# Patient Record
Sex: Female | Born: 1980 | Race: White | Hispanic: No | Marital: Married | Smoking: Never smoker
Health system: Southern US, Community
[De-identification: ages and names within clinical notes are randomized; demographics above are authoritative.]

## PROBLEM LIST (undated history)

## (undated) DIAGNOSIS — R55 Syncope and collapse: Secondary | ICD-10-CM

## (undated) DIAGNOSIS — Z87440 Personal history of urinary (tract) infections: Secondary | ICD-10-CM

## (undated) DIAGNOSIS — R1031 Right lower quadrant pain: Secondary | ICD-10-CM

## (undated) DIAGNOSIS — Z8659 Personal history of other mental and behavioral disorders: Secondary | ICD-10-CM

## (undated) DIAGNOSIS — E860 Dehydration: Secondary | ICD-10-CM

## (undated) DIAGNOSIS — A549 Gonococcal infection, unspecified: Secondary | ICD-10-CM

## (undated) DIAGNOSIS — E876 Hypokalemia: Secondary | ICD-10-CM

## (undated) DIAGNOSIS — T148XXA Other injury of unspecified body region, initial encounter: Secondary | ICD-10-CM

## (undated) DIAGNOSIS — N12 Tubulo-interstitial nephritis, not specified as acute or chronic: Secondary | ICD-10-CM

## (undated) DIAGNOSIS — R9431 Abnormal electrocardiogram [ECG] [EKG]: Secondary | ICD-10-CM

## (undated) DIAGNOSIS — F410 Panic disorder [episodic paroxysmal anxiety] without agoraphobia: Secondary | ICD-10-CM

## (undated) DIAGNOSIS — F319 Bipolar disorder, unspecified: Secondary | ICD-10-CM

## (undated) HISTORY — DX: Tubulo-interstitial nephritis, not specified as acute or chronic: N12

## (undated) HISTORY — DX: Syncope and collapse: R55

## (undated) HISTORY — DX: Abnormal electrocardiogram (ECG) (EKG): R94.31

## (undated) HISTORY — DX: Dehydration: E86.0

## (undated) HISTORY — DX: Other injury of unspecified body region, initial encounter: T14.8XXA

## (undated) HISTORY — DX: Right lower quadrant pain: R10.31

## (undated) HISTORY — PX: CARDIAC DEFIBRILLATOR PLACEMENT: SHX171

## (undated) HISTORY — DX: Panic disorder (episodic paroxysmal anxiety): F41.0

## (undated) HISTORY — DX: Hypokalemia: E87.6

## (undated) HISTORY — DX: Gonococcal infection, unspecified: A54.9

## (undated) HISTORY — DX: Personal history of urinary (tract) infections: Z87.440

## (undated) HISTORY — DX: Bipolar disorder, unspecified: F31.9

## (undated) HISTORY — DX: Personal history of other mental and behavioral disorders: Z86.59

---

## 2000-02-07 ENCOUNTER — Other Ambulatory Visit: Admission: RE | Admit: 2000-02-07 | Discharge: 2000-02-07 | Payer: Self-pay | Admitting: Obstetrics

## 2000-02-07 ENCOUNTER — Encounter (INDEPENDENT_AMBULATORY_CARE_PROVIDER_SITE_OTHER): Payer: Self-pay

## 2001-03-07 ENCOUNTER — Emergency Department (HOSPITAL_COMMUNITY): Admission: EM | Admit: 2001-03-07 | Discharge: 2001-03-07 | Payer: Self-pay | Admitting: Emergency Medicine

## 2002-12-07 ENCOUNTER — Encounter: Payer: Self-pay | Admitting: Obstetrics and Gynecology

## 2002-12-07 ENCOUNTER — Ambulatory Visit (HOSPITAL_COMMUNITY): Admission: RE | Admit: 2002-12-07 | Discharge: 2002-12-07 | Payer: Self-pay | Admitting: Obstetrics and Gynecology

## 2003-05-18 ENCOUNTER — Encounter: Payer: Self-pay | Admitting: Gastroenterology

## 2003-05-18 ENCOUNTER — Ambulatory Visit (HOSPITAL_COMMUNITY): Admission: RE | Admit: 2003-05-18 | Discharge: 2003-05-18 | Payer: Self-pay | Admitting: Gastroenterology

## 2003-06-07 ENCOUNTER — Emergency Department (HOSPITAL_COMMUNITY): Admission: EM | Admit: 2003-06-07 | Discharge: 2003-06-07 | Payer: Self-pay | Admitting: Emergency Medicine

## 2003-12-13 ENCOUNTER — Other Ambulatory Visit: Admission: RE | Admit: 2003-12-13 | Discharge: 2003-12-13 | Payer: Self-pay | Admitting: Internal Medicine

## 2004-10-25 ENCOUNTER — Inpatient Hospital Stay (HOSPITAL_COMMUNITY): Admission: AD | Admit: 2004-10-25 | Discharge: 2004-10-25 | Payer: Self-pay | Admitting: Obstetrics and Gynecology

## 2004-11-12 ENCOUNTER — Other Ambulatory Visit: Admission: RE | Admit: 2004-11-12 | Discharge: 2004-11-12 | Payer: Self-pay | Admitting: Obstetrics and Gynecology

## 2005-03-24 ENCOUNTER — Inpatient Hospital Stay (HOSPITAL_COMMUNITY): Admission: AD | Admit: 2005-03-24 | Discharge: 2005-03-24 | Payer: Self-pay | Admitting: Obstetrics and Gynecology

## 2005-04-21 ENCOUNTER — Ambulatory Visit (HOSPITAL_COMMUNITY): Admission: RE | Admit: 2005-04-21 | Discharge: 2005-04-21 | Payer: Self-pay | Admitting: Obstetrics and Gynecology

## 2005-05-06 ENCOUNTER — Inpatient Hospital Stay (HOSPITAL_COMMUNITY): Admission: AD | Admit: 2005-05-06 | Discharge: 2005-05-06 | Payer: Self-pay | Admitting: Obstetrics and Gynecology

## 2005-05-07 ENCOUNTER — Inpatient Hospital Stay (HOSPITAL_COMMUNITY): Admission: AD | Admit: 2005-05-07 | Discharge: 2005-05-07 | Payer: Self-pay | Admitting: Obstetrics and Gynecology

## 2005-05-09 ENCOUNTER — Inpatient Hospital Stay (HOSPITAL_COMMUNITY): Admission: AD | Admit: 2005-05-09 | Discharge: 2005-05-12 | Payer: Self-pay | Admitting: Obstetrics and Gynecology

## 2006-07-21 ENCOUNTER — Other Ambulatory Visit: Admission: RE | Admit: 2006-07-21 | Discharge: 2006-07-21 | Payer: Self-pay | Admitting: Obstetrics and Gynecology

## 2006-09-01 ENCOUNTER — Ambulatory Visit: Payer: Self-pay | Admitting: Internal Medicine

## 2006-09-01 ENCOUNTER — Observation Stay (HOSPITAL_COMMUNITY): Admission: EM | Admit: 2006-09-01 | Discharge: 2006-09-04 | Payer: Self-pay | Admitting: Emergency Medicine

## 2006-09-02 ENCOUNTER — Encounter (INDEPENDENT_AMBULATORY_CARE_PROVIDER_SITE_OTHER): Payer: Self-pay | Admitting: Interventional Cardiology

## 2006-09-24 ENCOUNTER — Ambulatory Visit: Payer: Self-pay | Admitting: Internal Medicine

## 2006-10-06 ENCOUNTER — Ambulatory Visit: Payer: Self-pay | Admitting: Internal Medicine

## 2006-10-12 ENCOUNTER — Inpatient Hospital Stay (HOSPITAL_COMMUNITY): Admission: RE | Admit: 2006-10-12 | Discharge: 2006-10-12 | Payer: Self-pay | Admitting: Internal Medicine

## 2006-10-12 ENCOUNTER — Ambulatory Visit: Payer: Self-pay | Admitting: Internal Medicine

## 2006-10-27 ENCOUNTER — Encounter: Admission: RE | Admit: 2006-10-27 | Discharge: 2006-10-27 | Payer: Self-pay | Admitting: Orthopedic Surgery

## 2006-11-05 ENCOUNTER — Ambulatory Visit: Payer: Self-pay

## 2007-03-09 ENCOUNTER — Ambulatory Visit: Payer: Self-pay | Admitting: Internal Medicine

## 2007-06-09 ENCOUNTER — Ambulatory Visit: Payer: Self-pay | Admitting: Internal Medicine

## 2007-09-07 ENCOUNTER — Ambulatory Visit: Payer: Self-pay | Admitting: Internal Medicine

## 2007-12-07 ENCOUNTER — Ambulatory Visit: Payer: Self-pay | Admitting: Internal Medicine

## 2008-04-04 ENCOUNTER — Ambulatory Visit: Payer: Self-pay | Admitting: Internal Medicine

## 2008-07-04 ENCOUNTER — Ambulatory Visit: Payer: Self-pay | Admitting: Internal Medicine

## 2008-10-03 ENCOUNTER — Ambulatory Visit: Payer: Self-pay | Admitting: Internal Medicine

## 2009-01-04 ENCOUNTER — Encounter: Payer: Self-pay | Admitting: Internal Medicine

## 2009-01-19 ENCOUNTER — Ambulatory Visit: Payer: Self-pay | Admitting: Internal Medicine

## 2009-03-22 DIAGNOSIS — I4581 Long QT syndrome: Secondary | ICD-10-CM | POA: Insufficient documentation

## 2009-04-24 ENCOUNTER — Ambulatory Visit: Payer: Self-pay | Admitting: Internal Medicine

## 2009-07-24 ENCOUNTER — Ambulatory Visit: Payer: Self-pay | Admitting: Internal Medicine

## 2009-07-25 ENCOUNTER — Encounter: Payer: Self-pay | Admitting: Internal Medicine

## 2009-08-09 ENCOUNTER — Encounter: Payer: Self-pay | Admitting: Internal Medicine

## 2009-10-09 ENCOUNTER — Ambulatory Visit: Payer: Self-pay | Admitting: Internal Medicine

## 2009-10-09 DIAGNOSIS — I951 Orthostatic hypotension: Secondary | ICD-10-CM | POA: Insufficient documentation

## 2010-01-25 ENCOUNTER — Encounter: Payer: Self-pay | Admitting: Internal Medicine

## 2010-02-04 ENCOUNTER — Inpatient Hospital Stay (HOSPITAL_COMMUNITY): Admission: EM | Admit: 2010-02-04 | Discharge: 2010-02-07 | Payer: Self-pay | Admitting: Internal Medicine

## 2010-02-04 ENCOUNTER — Encounter: Admission: RE | Admit: 2010-02-04 | Discharge: 2010-02-04 | Payer: Self-pay | Admitting: Internal Medicine

## 2010-02-11 ENCOUNTER — Encounter: Payer: Self-pay | Admitting: Internal Medicine

## 2010-02-25 ENCOUNTER — Ambulatory Visit: Payer: Self-pay | Admitting: Internal Medicine

## 2010-03-04 ENCOUNTER — Encounter: Payer: Self-pay | Admitting: Internal Medicine

## 2010-05-28 ENCOUNTER — Ambulatory Visit: Payer: Self-pay | Admitting: Internal Medicine

## 2010-05-29 ENCOUNTER — Encounter: Payer: Self-pay | Admitting: Internal Medicine

## 2010-06-19 ENCOUNTER — Encounter: Payer: Self-pay | Admitting: Internal Medicine

## 2010-08-29 ENCOUNTER — Ambulatory Visit: Payer: Self-pay | Admitting: Internal Medicine

## 2010-10-23 ENCOUNTER — Encounter: Payer: Self-pay | Admitting: Internal Medicine

## 2010-12-16 ENCOUNTER — Encounter (INDEPENDENT_AMBULATORY_CARE_PROVIDER_SITE_OTHER): Payer: Self-pay | Admitting: *Deleted

## 2010-12-31 NOTE — Cardiovascular Report (Signed)
Summary: Office Visit Remote   Office Visit Remote   Imported By: Roderic Ovens 06/20/2010 11:01:43  _____________________________________________________________________  External Attachment:    Type:   Image     Comment:   External Document

## 2010-12-31 NOTE — Letter (Signed)
Summary: Remote Device Check  [employer], Main Office  [address]   [place], [place] [address]   Phone: [phone]  Fax: [phone]     October 23, 2010 MRN: [id number]   [name] [address] [place], [place]  [address]   Dear Ms. Flatley,   Your remote transmission was recieved and reviewed by your physician.  All diagnostics were within normal limits for you.  _____Your next transmission is scheduled for:                       .  Please transmit at any time this day.  If you have a wireless device your transmission will be sent automatically.  __X____Your next office visit is scheduled for: 11-05-10 @ 1550 with Dr Graciela Husbands.     Sincerely,  Vella Kohler

## 2010-12-31 NOTE — Cardiovascular Report (Signed)
Summary: Office Visit Remote  Office Visit Remote   Imported By: Roderic Ovens 08/10/2009 14:19:26  _____________________________________________________________________  External Attachment:    Type:   Image     Comment:   External Document

## 2010-12-31 NOTE — Miscellaneous (Signed)
Summary: DEVICE PRELOAD  Clinical Lists Changes  Observations: Added new observation of ICD INDICATN: LONG QT, HOCM, BRUGADA, SYNCOPE (01/04/2009 14:19) Added new observation of ICDLEADSTAT1: active (01/04/2009 14:19) Added new observation of ICDLEADSER1: [id number] (01/04/2009 14:19) Added new observation of ICDLEADMOD1: 7002  (01/04/2009 14:19) Added new observation of ICDLEADDOI1: 10/12/2006  (01/04/2009 14:19) Added new observation of ICDLEADLOC1: RV  (01/04/2009 14:19) Added new observation of ICD IMP MD: Sherryl Manges, MD  (01/04/2009 14:19) Added new observation of ICD IMPL DTE: 10/12/2006  (01/04/2009 14:19) Added new observation of ICD SERL#: [id number] H  (01/04/2009 14:19) Added new observation of ICD MODL#: 7232  (01/04/2009 14:19) Added new observation of ICDMANUFACTR: Medtronic  (01/04/2009 14:19) Added new observation of CARDIO MD: Sherryl Manges, MD  (01/04/2009 14:19)      ICD Specifications Following MD:  Sherryl Manges, MD     ICD Vendor:  Medtronic     ICD Model Number:  7232     ICD Serial Number:  [id number] ICD DOI:  10/12/2006     ICD Implanting MD:  Sherryl Manges, MD  Lead 1:    Location: RV     DOI: 10/12/2006     Model #: 3086     Serial #: [id number]     Status: active  Indications::  LONG QT, HOCM, BRUGADA, SYNCOPE

## 2010-12-31 NOTE — Letter (Signed)
Summary: Remote Device Check  [facility] [employer], Main Office  [address]   [place], [place] [address]   Phone: [phone]  Fax: [phone]     August 09, 2009 MRN: [id number]   ALLISA EINSPAHR [address] [place], [place]  [address]   Dear Ms. Hohn,   Your remote transmission was recieved and reviewed by your physician.  All diagnostics were within normal limits for you.    ___X___Your next office visit is scheduled for:      November 2010 with Dr Graciela Husbands.  Please call our office to schedule an appointment.    Sincerely,  Proofreader

## 2010-12-31 NOTE — Assessment & Plan Note (Signed)
Summary: defib check.mdt.amber   History of Present Illness: [name] is seen in follow up for syncope and long QT syndrome. She has had no intercurrent episodes.  She is doing quite well  She has however had problems with orthostatic lightheadedness. She has shower/back intolerance.  Her diet is salt replete but fluid deplete.    Allergies: 1)  ! * Sulfa Drugs  Vital Signs:  Patient profile:   30 year old female Height:      64 inches Weight:      148 pounds BMI:     25.50 Pulse rate:   75 / minute Pulse rhythm:   regular BP sitting:   94 / 60  (left arm) Cuff size:   large  Vitals Entered By: Judithe Modest CMA (October 09, 2009 4:02 PM)  Physical Exam  General:  The patient was alert and oriented in no acute distress. HEENT Normal.  Neck veins were flat, carotids were brisk.  Lungs were clear.  Heart sounds were regular without murmurs or gallops.  Abdomen was soft with active bowel sounds. There is no clubbing cyanosis or edema. Skin Warm and dry     ICD Specifications Following MD:  Sherryl Manges, MD     ICD Vendor:  Medtronic     ICD Model Number:  7232     ICD Serial Number:  [id number] ICD DOI:  10/12/2006     ICD Implanting MD:  Sherryl Manges, MD  Lead 1:    Location: RV     DOI: 10/12/2006     Model #: 7002     Serial #: [id number]     Status: active  Indications::  LONG QT, HOCM, BRUGADA, SYNCOPE   ICD Follow Up Remote Check?  No Battery Voltage:  3.14 V     Charge Time:  7.49 seconds     Underlying rhythm:  SR@81  ICD Dependent:  No       ICD Device Measurements Right Ventricle:  Amplitude: 5.4 mV, Impedance: 480 ohms, Threshold: 2.0 V at 0.4 msec Shock Impedance: 68 ohms   Episodes Coumadin:  No Shock:  0     ATP:  0     Nonsustained:  0      Brady Parameters Mode VVI     Lower Rate Limit:  40      Tachy Zones VF:  222     VT1:  162     Next Remote Date:  01/08/2010     Next Cardiology Appt Due:  10/01/2010 Tech Comments:  No parameter  changes.   Device function normal.  [facility] transmissions every 3 months.   ROV 1 year Dr. Graciela Husbands. Altha Harm, LPN  October 09, 2009 4:07 PM   Impression & Recommendations:  Problem # 1:  LONG QT SYNDROME (ICD-426.82) She had no intercurrent problems with ventricular tachycardia or syncope. She is tolerating her not all. She takes it at night. Her updated medication list for this problem includes:    Nadolol 40 Mg Tabs (Nadolol) .Marland Kitchen... 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 We discussed the importance of salt and water repletion. She does preload a former and lousy with the latter. We also discussed maneuvers that she can undertake to try to prevent  Appended Document: defib check.mdt.amber F/U 12 MONTHS WITH DR. Graciela Husbands

## 2010-12-31 NOTE — Cardiovascular Report (Signed)
Summary: Office Visit Remote   Office Visit Remote   Imported By: Roderic Ovens 03/12/2010 16:43:25  _____________________________________________________________________  External Attachment:    Type:   Image     Comment:   External Document

## 2010-12-31 NOTE — Letter (Signed)
Summary: Remote Device Check  [facility] [employer], Main Office  [address]   [place], [place] [address]   Phone: [phone]  Fax: [phone]     March 04, 2010 MRN: [id number]   [name] [address] [place], [place]  [address]   Dear [name],   Your remote transmission was recieved and reviewed by your physician.  All diagnostics were within normal limits for you.  ___X__Your next transmission is scheduled for:   May 28, 2010.  Please transmit at any time this day.  If you have a wireless device your transmission will be sent automatically.     Sincerely,  Proofreader

## 2010-12-31 NOTE — Assessment & Plan Note (Signed)
Summary: 3:50/PC2/KFW   CC:  device check, fatigue, and and chest pain that comes and goes.  History of Present Illness: [name] is seen in follow up for syncope and long QT syndrome. She has had no intercurrent episodes. Review of her monitor demonstrated a few nonsustained episodes more than a year ago; I don't have those stated he wanted to review them.  She raises a number of questions today the first is tan she start to exercise.st that I would be a reasonable thing to do we will continue her on her Corgard for the current time.  She asked whether she could start driving and I told her yes. There has been a great deal of anxiety about returning behind the wheel of a car since her sick also occurred in this con I reviewed with them the statistical likelihood of having an episode behind the wheel of a car.  The other issues she asked whether she could decrease her Corgard; I suggested we do want to get a time as the exercise above  Current Medications (verified): 1)  Prozac 20 Mg Caps (Fluoxetine Hcl) .... Three Times Daily 2)  Nadolol 40 Mg Tabs (Nadolol) .... Take One Tablet By Mouth Daily 3)  Alprazolam 1 Mg Tabs (Alprazolam) .... Take 1/2 To 1 Tablet As Needed  Allergies (verified): 1)  ! * Sulfa Drugs  Vital Signs:  Patient profile:   30 year old female Height:      64 inches Weight:      142.75 pounds Pulse rate:   54 / minute Pulse rhythm:   regular BP sitting:   102 / 76  (left arm) Cuff size:   regular  Vitals Entered By: Judithe Modest CMA (Apr 24, 2009 11:54 AM)  Physical Exam  General:  The patient was alert and oriented in no acute distress.Neck veins were flat, carotids were brisk. Lungs were clear. Heart sounds were regular without murmurs or gallops. Abdomen was soft with active bowel sounds. There is no clubbing cyanosis or edema.    ICD Specifications Following MD:  Sherryl Manges, MD     ICD Vendor:  Medtronic     ICD Model Number:  7232     ICD Serial  Number:  [id number] ICD DOI:  10/12/2006     ICD Implanting MD:  Sherryl Manges, MD  Lead 1:    Location: RV     DOI: 10/12/2006     Model #: 7002     Serial #: [id number]     Status: active  Indications::  LONG QT, HOCM, BRUGADA, SYNCOPE   ICD Specs Remote Check?  No Battery Voltage:  3.13 V     Charge Time:  7.47 seconds     Underlying rhythm:  SR   ICD Device Measurements Atrium:  Right Ventricle:  Amplitude: 7.0 mV, Impedance: 560 ohms, Threshold: 2.0 V at 0.4 msec Left Ventricle:  Shock Impedance: 76 ohms   Episodes Coumadin:  No Shock:  0     ATP:  0     Nonsustained:  3     Ventricular Pacing:  0  Brady Parameters Mode VVI     Lower Rate Limit:  40      Tachy Zones VF:  222     VT1:  162     Next Remote Date:  07/24/2009     Next Cardiology Appt Due:  03/31/2010 Tech Comments:  No changes [facility] Altha Harm, LPN  Apr 24, 2009 12:09 PM  Impression & Recommendations:  Problem # 1:  LONG QT SYNDROME (ICD-426.82) She is doing quite well. As noted in the history of present illness will allow her to begin low-level exercise. We have encouraged her to drive. And she is cleared to go back to work after her son start school in the fall Her updated medication list for this problem includes:    Nadolol 40 Mg Tabs (Nadolol) .Marland Kitchen... Take one tablet by mouth daily  Problem # 2:  IMPLANTATION OF DEFIBRILLATOR/MEDTRONIC MAXIMO VR 7232 (ICD-V45.02) Normal device function.  Patient Instructions: 1)  Your physician recommends that you schedule a follow-up appointment in: 6 months time

## 2010-12-31 NOTE — Cardiovascular Report (Signed)
Summary: Office Visit Remote   Office Visit Remote   Imported By: Roderic Ovens 11/04/2010 15:02:22  _____________________________________________________________________  External Attachment:    Type:   Image     Comment:   External Document

## 2010-12-31 NOTE — Letter (Signed)
Summary: Device-Delinquent Phone [employer], [employer], Main Office  [address]   [place], [place] [address]   Phone: [phone]  Fax: [phone]     January 25, 2010 MRN: [id number]   DEISI SALONGA [address] [place], [place]  [address]   Dear Ms. Stainback,  According to our records, you were scheduled for a device phone transmission on January 08, 2010.     We did not receive any results from this check.  If you transmitted on your scheduled day, please call us to help troubleshoot your system.  If you forgot to send your transmission, please send one upon receipt of this letter.  Thank you,   Architectural technologist [facility]

## 2010-12-31 NOTE — Letter (Signed)
Summary: Remote Device Check  [employer], Main Office  [address]   [place], [place] [address]   Phone: [phone]  Fax: [phone]     June 19, 2010 MRN: [id number]   AERALYN BARNA [address] [place], [place]  [address]   Dear Ms. Reisz,   Your remote transmission was recieved and reviewed by your physician.  All diagnostics were within normal limits for you.  __X___Your next transmission is scheduled for:   08-29-2010.  Please transmit at any time this day.  If you have a wireless device your transmission will be sent automatically.   Sincerely,  Vella Kohler

## 2010-12-31 NOTE — Letter (Signed)
Summary: Device-Delinquent Phone Journalist, newspaper, Main Office  [address]   [place], [place] [address]   Phone: [phone]  Fax: [phone]     February 11, 2010 MRN: [id number]   [name] [address] [place], [place]  [address]   Dear Ms. Nelles,  According to our records, you were scheduled for a device phone transmission on January 08, 2010.     We did not receive any results from this check.  If you transmitted on your scheduled day, please call us to help troubleshoot your system.  If you forgot to send your transmission, please send one upon receipt of this letter.  Thank you,   Architectural technologist [facility]

## 2011-01-02 NOTE — Letter (Signed)
Summary: Appointment - Missed  [facility], Main Office  [address]   [place], [place] [address]   Phone: [phone]  Fax: [phone]     December 16, 2010 MRN: [id number]   [name] [address] [place], [place]  [address]   Dear Ms. Bass,  Our records indicate you missed your appointment on 12-6-2011with Dr.Klein.                                    It is very important that we reach you to reschedule this appointment. We look forward to participating in your health care needs. Please contact us at the number listed above at your earliest convenience to reschedule this appointment.     Sincerely,       Lorne Skeens   [facility] Scheduling Team

## 2011-01-08 ENCOUNTER — Encounter (INDEPENDENT_AMBULATORY_CARE_PROVIDER_SITE_OTHER): Payer: Managed Care, Other (non HMO) | Admitting: Internal Medicine

## 2011-01-08 ENCOUNTER — Encounter: Payer: Self-pay | Admitting: Internal Medicine

## 2011-01-08 DIAGNOSIS — I4581 Long QT syndrome: Secondary | ICD-10-CM

## 2011-01-08 DIAGNOSIS — I498 Other specified cardiac arrhythmias: Secondary | ICD-10-CM

## 2011-01-16 NOTE — Assessment & Plan Note (Signed)
Summary: pc2. gd   CC:  pacer check.  Pt states she is very tired and has concerns over recalled lead.  Pt is very nervous about what this means for her.  History of Present Illness: [name] is seen in follow up for syncope and long QT syndrome. She has had no intercurrent episodes.  She is doing quite well  She has however had problems with orthostatic lightheadedness. Dizziness is better with standing.  Her diet is salt replete but fluid deplete.   she also has symptoms of a cold as is her husband  Current Medications (verified): 1)  Prozac 20 Mg Caps (Fluoxetine Hcl) .... Three Times Daily 2)  Nadolol 40 Mg Tabs (Nadolol) .... Take One Tablet By Mouth Daily 3)  Alprazolam 1 Mg Tabs (Alprazolam) .... Take 1/2 To 1 Tablet As Needed  Allergies (verified): 1)  ! * Sulfa Drugs  Past History:  Past Medical History: Last updated: 03/22/2009 Allergic rhinitis Bipolar disorder Cardiac defibrillator-Medtronic Maximo VR 7232Cx   Syncope  Long QT  Past Surgical History: Last updated: 03/22/2009 AICD-Medtronic Maximo VR 7232Cx  Family History: Last updated: 03/22/2009 Father:Aneurysm at 45 yrs. Mother:good health Siblings:half brother and sister good health Maternal grandfather:  Pacemaker, age 90.  Social History: Last updated: 03/22/2009 Married  Tobacco Use - No.  Alcohol Use - yes occasional  Vital Signs:  Patient profile:   30 year old female Height:      64 inches Weight:      156 pounds BMI:     26.87 Pulse rate:   48 / minute Pulse rhythm:   regular BP sitting:   110 / 64  (left arm) Cuff size:   large  Vitals Entered By: [name] CMA (January 08, 2011 8:53 AM)  Physical Exam  General:  The patient was alert and oriented in no acute distress. HEENT Normal.  Neck veins were flat, carotids were brisk.  Lungs were clear.  Heart sounds were regular without murmurs or gallops.  Abdomen was soft with active bowel sounds. There is no clubbing  cyanosis or edema. Skin Warm and dry     ICD Specifications Following MD:  Andrea Manges, MD     ICD Vendor:  Medtronic     ICD Model Number:  7232     ICD Serial Number:  [id number] ICD DOI:  10/12/2006     ICD Implanting MD:  Andrea Manges, MD  Lead 1:    Location: RV     DOI: 10/12/2006     Model #: 7002     Serial #: [id number]     Status: active  Indications::  LONG QT, HOCM, BRUGADA, SYNCOPE   ICD Follow Up Remote Check?  No Battery Voltage:  3.10 V     Charge Time:  7.76 seconds     Underlying rhythm:  Brady @ 52 ICD Dependent:  No       ICD Device Measurements Right Ventricle:  Amplitude: 5.7 mV, Impedance: 488 ohms, Threshold: 1.5 V at 0.7 msec Shock Impedance: 74 ohms   Episodes Coumadin:  No Shock:  0     ATP:  0     Nonsustained:  1      Brady Parameters Mode VVI     Lower Rate Limit:  40      Tachy Zones VF:  222     VT1:  162     Next Remote Date:  04/10/2011     Next Cardiology Appt Due:  01/02/2012 Tech Comments:  Lead impedance alert values reprogrammed.  1SVT episode noted.   [facility] transmissions every 3 months.  ROV 1 year with Dr. Graciela Husbands. Altha Harm, LPN  January 08, 2011 9:16 AM   Impression & Recommendations:  Problem # 1:  LONG QT SYNDROME (ICD-426.82) NO INTERCURRENT ARRHYHMIA Her updated medication list for this problem includes:    Nadolol 40 Mg Tabs (Nadolol) .Marland Kitchen... 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 The device was reprogrammed to lower the impedance window; the 200 impedance ranged plus or minus current measurements was able to be competent on the low end but not on the high end as the minimum pacing impedance was 1000 ohms  Problem # 4:  IMPLANTATION OF DEFIBRILLATOR/MEDTRONIC MAXIMO VR 7232  (ICD-V45.02) Device parameters and data were reviewed and changes were made as above  Problem # 5:  URI (ICD-465.9) We give her some aspirin   Medication Administration  Medication # 1:    Medication: Tylenol 500 mg tab    Dose: 2 tablets    Route: po    Exp Date: 08/02/2011    Lot #: [id number]    Mfr: Uvaldo Rising    Patient tolerated medication without complications    Given by: Dossie Arbour, RN, BSN (January 08, 2011 9:57 AM)  Orders Added: 1)  EKG w/ Interpretation [93000]  Appended Document: pc2. gd ECG sinus rhythm at 48 General of 0.14/2008/0.44  Appended Document: [facility]     Allergies: 1)  ! * Sulfa Drugs    ICD Specifications Following MD:  Andrea Manges, MD     ICD Vendor:  Medtronic     ICD Model Number:  7232     ICD Serial Number:  [id number] ICD DOI:  10/12/2006     ICD Implanting MD:  Andrea Manges, MD  Lead 1:    Location: RV     DOI: 10/12/2006     Model #: 7002     Serial #: [id number]     Status: active  Indications::  LONG QT, HOCM, BRUGADA, SYNCOPE   ICD Follow Up ICD Dependent:  No      Episodes Coumadin:  No  Brady Parameters Mode VVI     Lower Rate Limit:  40      Tachy Zones VF:  222     VT1:  162     Impression & Recommendations:  Problem # 1:  SINUS BRADYCARDIA (ICD-427.81) we'll keep our eye on this her Corgard dose is low it is been used because of the long QT syndrome. not sure we have attributable symptoms Her updated medication list for this problem includes:    Nadolol 40 Mg Tabs (Nadolol) .Marland Kitchen... Take one tablet by mouth daily

## 2011-02-23 LAB — BASIC METABOLIC PANEL
BUN: 3 mg/dL — ABNORMAL LOW (ref 6–23)
BUN: 3 mg/dL — ABNORMAL LOW (ref 6–23)
CO2: 25 mEq/L (ref 19–32)
CO2: 27 mEq/L (ref 19–32)
Calcium: 7.7 mg/dL — ABNORMAL LOW (ref 8.4–10.5)
Calcium: 8.3 mg/dL — ABNORMAL LOW (ref 8.4–10.5)
Chloride: 109 mEq/L (ref 96–112)
Chloride: 99 mEq/L (ref 96–112)
Creatinine, Ser: 0.84 mg/dL (ref 0.4–1.2)
Creatinine, Ser: 0.87 mg/dL (ref 0.4–1.2)
GFR calc Af Amer: >60 mL/min (ref >60)
GFR calc Af Amer: >60 mL/min (ref >60)
GFR calc non Af Amer: >60 mL/min (ref >60)
GFR calc non Af Amer: >60 mL/min (ref >60)
Glucose, Bld: 120 mg/dL — ABNORMAL HIGH (ref 70–99)
Glucose, Bld: 129 mg/dL — ABNORMAL HIGH (ref 70–99)
Potassium: 2.6 mEq/L — CL (ref 3.5–5.1)
Potassium: 3.8 mEq/L (ref 3.5–5.1)
Sodium: 130 mEq/L — ABNORMAL LOW (ref 135–145)
Sodium: 140 mEq/L (ref 135–145)

## 2011-02-23 LAB — CBC
HCT: 30.6 % — ABNORMAL LOW (ref 36.0–46.0)
HCT: 31.6 % — ABNORMAL LOW (ref 36.0–46.0)
HCT: 34 % — ABNORMAL LOW (ref 36.0–46.0)
HCT: 37 % (ref 36.0–46.0)
Hemoglobin: 10.8 g/dL — ABNORMAL LOW (ref 12.0–15.0)
Hemoglobin: 10.9 g/dL — ABNORMAL LOW (ref 12.0–15.0)
Hemoglobin: 11.7 g/dL — ABNORMAL LOW (ref 12.0–15.0)
Hemoglobin: 12.7 g/dL (ref 12.0–15.0)
MCHC: 34.3 g/dL (ref 30.0–36.0)
MCHC: 34.3 g/dL (ref 30.0–36.0)
MCHC: 34.5 g/dL (ref 30.0–36.0)
MCHC: 35.4 g/dL (ref 30.0–36.0)
MCV: 93 fL (ref 78.0–100.0)
MCV: 93.4 fL (ref 78.0–100.0)
MCV: 93.5 fL (ref 78.0–100.0)
MCV: 94.1 fL (ref 78.0–100.0)
Platelets: 186 10*3/uL (ref 150–400)
Platelets: 206 10*3/uL (ref 150–400)
Platelets: 217 10*3/uL (ref 150–400)
Platelets: 240 10*3/uL (ref 150–400)
RBC: 3.29 MIL/uL — ABNORMAL LOW (ref 3.87–5.11)
RBC: 3.39 MIL/uL — ABNORMAL LOW (ref 3.87–5.11)
RBC: 3.64 MIL/uL — ABNORMAL LOW (ref 3.87–5.11)
RBC: 3.93 MIL/uL (ref 3.87–5.11)
RDW: 13 % (ref 11.5–15.5)
RDW: 13 % (ref 11.5–15.5)
RDW: 13.4 % (ref 11.5–15.5)
RDW: 13.4 % (ref 11.5–15.5)
WBC: 13 10*3/uL — ABNORMAL HIGH (ref 4.0–10.5)
WBC: 18 10*3/uL — ABNORMAL HIGH (ref 4.0–10.5)
WBC: 20.9 10*3/uL — ABNORMAL HIGH (ref 4.0–10.5)
WBC: 9.7 10*3/uL (ref 4.0–10.5)

## 2011-02-23 LAB — COMPREHENSIVE METABOLIC PANEL
ALT: 20 U/L (ref 0–35)
AST: 17 U/L (ref 0–37)
Albumin: 3.5 g/dL (ref 3.5–5.2)
Alkaline Phosphatase: 99 U/L (ref 39–117)
BUN: 4 mg/dL — ABNORMAL LOW (ref 6–23)
CO2: 26 mEq/L (ref 19–32)
Calcium: 9.2 mg/dL (ref 8.4–10.5)
Chloride: 98 mEq/L (ref 96–112)
Creatinine, Ser: 0.9 mg/dL (ref 0.4–1.2)
GFR calc Af Amer: >60 mL/min (ref >60)
GFR calc non Af Amer: >60 mL/min (ref >60)
Glucose, Bld: 107 mg/dL — ABNORMAL HIGH (ref 70–99)
Potassium: 2.9 mEq/L — ABNORMAL LOW (ref 3.5–5.1)
Sodium: 132 mEq/L — ABNORMAL LOW (ref 135–145)
Total Bilirubin: 0.6 mg/dL (ref 0.3–1.2)
Total Protein: 7.9 g/dL (ref 6.0–8.3)

## 2011-02-23 LAB — URINALYSIS, MICROSCOPIC ONLY
Bilirubin Urine: NEGATIVE
Glucose, UA: NEGATIVE mg/dL
Ketones, ur: NEGATIVE mg/dL
Nitrite: NEGATIVE
Protein, ur: NEGATIVE mg/dL
Specific Gravity, Urine: 1.041 — ABNORMAL HIGH (ref 1.005–1.030)
Urobilinogen, UA: 0.2 mg/dL (ref 0.0–1.0)
pH: 6 (ref 5.0–8.0)

## 2011-02-23 LAB — URINE CULTURE
Colony Count: 50000
Special Requests: NEGATIVE

## 2011-02-23 LAB — CULTURE, BLOOD (ROUTINE X 2): Culture: NO GROWTH

## 2011-02-23 LAB — PREGNANCY, URINE: Preg Test, Ur: NEGATIVE

## 2011-04-10 ENCOUNTER — Encounter: Payer: Managed Care, Other (non HMO) | Admitting: *Deleted

## 2011-04-15 NOTE — Assessment & Plan Note (Signed)
[facility]                         ELECTROPHYSIOLOGY OFFICE NOTE   [name], [name]                    MRN:          [id number]  DATE:04/04/2008                            DOB:          [birth year]    [name] is seen in follow-up for syncope in the setting of long QT  about 18 months ago.  She has had no intercurrent syncope.  She has had  problems however with presyncope which is largely positional,  accentuated around the time of her period.  Her fluid intake is quite  notable but it is loaded with caffeinated beverages.   MEDICATIONS:  1. Prozac 20 b.i.d.  2. Nadolol 40.   PHYSICAL EXAMINATION:  Her blood pressure was 90/60 today, her pulse was  60.  LUNGS:  Clear.  HEART:  Sounds were regular.  EXTREMITIES:  Without edema.  SKIN:  Warm and dry.  She was in no acute distress.   Interrogation of her Medtronic ICD demonstrates an R-wave of 7.4 with  impedance of 520, threshold 2 volts at 0.4.  Battery voltage 3.19.   IMPRESSION:  1. Long QT syndrome.  2. Syncope associated with #1.  3. Status post ICD for #1 and #2.  4. Orthostatic intolerance.   [name] is doing fine from a tachyarrhythmia point of view.  We have  reviewed the mechanisms of orthostatic intolerance as well as some  nonmedicinal therapeutic intervention, i.e. increasing fluid intake  around the time her menses, decreasing her caffeine intake to reduce its  diuretic effects and maintain her salt intake.   We will see her again in one year's time.     Duke Salvia, MD, [facility]  Electronically Signed    SCK/MedQ  DD: 04/04/2008  DT: 04/04/2008  Job #: [id number]

## 2011-04-16 ENCOUNTER — Encounter: Payer: Self-pay | Admitting: *Deleted

## 2011-04-18 NOTE — Op Note (Signed)
   [name], [name]                         ACCOUNT NO.:  [id number]   MEDICAL RECORD NO.:  [id number]                   PATIENT TYPE:  AMB   LOCATION:  ENDO                                 FACILITY:  [facility]   PHYSICIAN:  John C. Madilyn Fireman, M.D.                 DATE OF BIRTH:  14-Dec-1980   DATE OF PROCEDURE:  05/18/2003  DATE OF DISCHARGE:                                 OPERATIVE REPORT   PROCEDURE:  Esophagogastroduodenoscopy.   INDICATION FOR PROCEDURE:  Black heme-positive stools and anemia.   DESCRIPTION OF PROCEDURE:  The patient was placed in the left lateral  decubitus position and placed on the pulse monitor with continuous low-flow  oxygen delivered by nasal cannula.  She was sedated with 75 mcg IV fentanyl  and 6 mg IV Versed.  The Olympus video endoscope was advanced under direct  vision into the oropharynx and esophagus.  The esophagus was straight and of  normal caliber with the squamocolumnar line at 38 cm.  There was a minimal,  widely patent lower esophageal ring seen when the LES was fully relaxed.  Otherwise, no abnormality was noted of the esophagus.  The stomach was  entered, and a small amount of liquid secretions were suctioned from the  fundus.  Retroflexed view of the cardia was unremarkable.  The fundus, body,  antrum, and pylorus all appeared normal.  The duodenum was entered, and both  the bulb and second portion were well-inspected and appeared to be within  normal limits.  The scope was then withdrawn and this patient returned to  the recovery room in stable condition.  She tolerated the procedure well,  and there were no immediate complications.   IMPRESSION:  1. Patent lower esophageal ring.  2. Otherwise, normal study.    PLAN:  1. Consider Merckle scan given documentation of bleeding and negative EGD.  2. Continue Protonix for now.                                               John C. Madilyn Fireman, M.D.    JCH/MEDQ  D:  05/18/2003  T:   05/18/2003  Job:  [id number]   cc:   Georgann Housekeeper, M.D.  [address]  [place]  [place] [address]  Fax: [phone]   Lilyan Punt. Sydnee Levans, M.D.  [address]  [place]  [place] [address]  Fax: [phone]
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
## 2011-04-18 NOTE — Assessment & Plan Note (Signed)
[facility]                         ELECTROPHYSIOLOGY OFFICE NOTE   [name], [name]                    MRN:          [id number]  DATE:03/09/2007                            DOB:          1981-07-02    [name] is seen following ICD implantation for syncope in the context  of a prolonged QT interval.  She is doing okay from that point of view.  She has some fatigue, and I wonder if this is related to the Inderal-LA.  On examination, her blood pressure was 102/66, pulse was 60, lungs were  clear, heart sounds were regular, and the device pocket was well healed.   Interrogation of her Medtronic device demonstrates an R-wave of 6.4 with  an impedence of 656 and a threshold of 2 volts at 0.3.  Battery voltage  is 3.21.  There are no intercurrent episodes.   IMPRESSION:  1. Syncope.  2. Long QT.  3. Status post implantable cardioverter-defibrillator for the above.  4. Fatigue, question related to her Inderal.   I have given her prescriptions for alternative beta blockers.   If there is anything we can do further, we will be glad to help.  There  may be some disability issues that we can help support her, also.     Duke Salvia, MD, [facility]  Electronically Signed    SCK/MedQ  DD: 03/12/2007  DT: 03/12/2007  Job #: [id number]   cc:   Georgann Housekeeper, MD

## 2011-04-18 NOTE — Assessment & Plan Note (Signed)
 HEALTHCARE                         ELECTROPHYSIOLOGY OFFICE NOTE   [name], [name]                    MRN:          [id number]  DATE:03/09/2007                            DOB:          [birth year]    [name] is seen, she has syncope with long QT status post ICD  implantation undertaken in November 2007. She has had no recurrent  syncope, there is no intercurrent episodes.   Her major complaint is of tiredness and dizziness.   MEDICATIONS:  1. Inderal LA 80.  2. Prozac 20 b.i.d..   PHYSICAL EXAMINATION:  VITAL SIGNS:  Her blood pressure is 102/66, pulse  was 60.  LUNGS:  Clear.  HEART:  Sounds were regular.  EXTREMITIES:  Without edema.   Interrogation of her Medtronic [id number] ICD demonstrates an R wave of 6.4  with impedance of 656 and a threshold of 2 volts at 0.3, battery voltage  3.21. There are 6 episodes of nonsustained tachycardia. The device was  reprogrammed to assure capture.   IMPRESSION:  1. Long QT with syncope.  2. Status post ICD for the above.  3. Beta blocker.  4. Fatigue, question related to 3.   I have given her prescriptions for metoprolol ER 50, nadolol 40, and  atenolol 50 to take as an alternative. She will let us know after she  has gone through these trials to see if any of them ameliorate her  symptoms.   Otherwise we will see her again back in November.     Duke Salvia, MD, [facility]  Electronically Signed    SCK/MedQ  DD: 03/09/2007  DT: 03/09/2007  Job #: [id number]
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
## 2011-04-18 NOTE — Assessment & Plan Note (Signed)
[facility]                                 ON-CALL NOTE   [name], [name]                    MRN:          [id number]  DATE:07/22/2007                            DOB:          1981-01-17    [name] is a 30 year old Caucasian female of Dr. Sherryl Manges with a  history of cardiac defibrillator which was implanted in December 2007  secondary to prolonged QT interval and near syncope with palpitations.  The patient calls stating that she took an NSAID because she was having  some pain with her teeth and realized that after she had taken it two  days ago that she should not have taken it as a result of her prolonged  QT interval syndrome. At the time that the patient called, I did not  have access to her records and she was very vague about what she had  taken, and was having trouble looking up the medications on the  Internet. The patient became very inpatient with me as I did not have  access to her records and did not know her history. When I requested her  history, she stated that she had a syndrome and that Dr. Graciela Husbands was  treating for it and did not want her to take any NSAIDs. When I asked  her why that she was not to take the NSAIDs, she said that she did not  know. She thought that it had something to do with her stomach and her  syndrome. When I proceeded to ask of what she syndrome she had, she  again became inpatient and stated that she had a prolonged QT syndrome.  At the time when the patient was talking with me, she had her 68-year-old  son on her lap who was crying loudly and it was difficult for me to hear  her. I again questioned the patient on what I could do for her and she  became very inpatient and stated   if you are going to have that  attitude, there is nothing you can do for me.  When I persisted and  stated that I was available to help her with whatever questions she had,  she stated that she was able to find the medicine  on the Internet and no  longer needed my help and hung up on me.     Bettey Mare. Lyman Bishop, NP  Electronically Signed    KML/MedQ  DD: 07/22/2007  DT: 07/24/2007  Job #: [id number]
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
## 2011-04-18 NOTE — Op Note (Signed)
NAME:  CIRA, DEYOE NO.:  [id number]   MEDICAL RECORD NO.:  [id number]          PATIENT TYPE:  INP   LOCATION:  6533                         FACILITY:  [facility]   PHYSICIAN:  Duke Salvia, MD, FACCDATE OF BIRTH:  1981/11/28   DATE OF PROCEDURE:  10/12/2006  DATE OF DISCHARGE:  10/12/2006                                 OPERATIVE REPORT   PREOPERATIVE DIAGNOSIS:  Syncope with long QT.   POSTOPERATIVE DIAGNOSIS:  Syncope with long QT.   PROCEDURE:  Implantable defibrillator.   SURGEON:  Duke Salvia, MD, [facility].   DESCRIPTION OF PROCEDURE:  Following the obtaining of informed consent, the  patient was brought to the electrophysiology laboratory and placed on the  fluoroscopic table in the supine position.  After routine prep and drape of  the left  upper chest, lidocaine was infiltrated in the  prepectoral/subclavicular region.  An incision was made and carried down to  the layer of the prepectoral fascia using electrocautery and sharp  dissection.  A pocket was formed similarly.  Hemostasis was obtained.   Thereafter, attention was turned to gaining access to the extrathoracic left  subclavian vein, which was accomplished without difficulty and without the  aspiration of air of puncture of the artery.  A single venipuncture was  accomplished.  A guide wire was placed and retained.   Sequentially, 7-French tear-away introducer sheaths were placed, through  which was passed initially a St. Jude 7002, 60-cm, single-coil active-  fixation defibrillator lead, serial #[id number].  This was manipulated to the  right ventricular apex, where the bipolar R wave was 8.3, with a pacing  impedance of 1321 ohms, and a threshold of 2.1 V at 0.5 msec.  Current at  threshold was 1.8 mA.  The current of injury was brisk, and there was no  diaphragmatic pacing at 10 V.  This lead was then attached to a Medtronic  Maximo [id number] ICD, serial #[id number].  Through the device,  the bipolar R  wave was 6.3 with a threshold of 1 V.  The pacing impedance was 610 ohms,  and the defibrillator coil impedance was 63 ohms.   At this point, defibrillation threshold testing was undertaken.  Ventricular  fibrillation was induced via the T-wave shock.  After a total duration of 6  seconds, a 13-J shock was delivered through a measured resistance of 58  ohms, terminating ventricular fibrillation and restoring sinus rhythm.  At  this point, the device was implanted.  The pocket was copiously irrigated  with antibiotic-containing saline solution.  Hemostasis was assured, and the  lead and the pulse generator were placed in the pocket and secured to the  prepectoral fascia.  The wound was then closed in 3  layers in the normal fashion.  The wound was washed and dried, and a benzoin  and Steri-Strips dressing was applied.  Needle counts, sponge counts and  instrument counts were correct at the end of the procedure according to the  staff.  The patient tolerated the procedure without apparent complication.      Duke Salvia, MD, [facility]  Electronically  Signed     SCK/MEDQ  D:  10/12/2006  T:  10/12/2006  Job:  [id number]   cc:   Georgann Housekeeper, MD  Electrophysiology Laboratory  [facility]

## 2011-04-18 NOTE — Discharge Summary (Signed)
NAME:  Andrea, [name] NO.:  [id number]   MEDICAL RECORD NO.:  [id number]                   PATIENT TYPE:  OUT   LOCATION:  XRAY                                 FACILITY:  [facility]   PHYSICIAN:  [name], M.D.                     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   PROCEDURES:  Decompressive laminectomy and posterior lumbar interbody  fusion, L4-5 with transitional segment.   SURGEON:  [name], M.D.   ASSISTANT:  [name], M.D.   ANESTHESIA:  General endotracheal.   HISTORY OF PRESENT ILLNESS:  The patient is a very pleasant 30 year old  female who has had longstanding back and leg pain radiating down in what  appeared to be an L5 and L4 distribution to the top of her foot and big toe.  Preoperative imaging showed severe spinal stenosis and spondylolisthesis.  The patient was recommended a decompressive stabilization procedure.  I  extensively went over the risks and benefits of surgery with her, and she  decided to proceed forward.   HOSPITAL COURSE:  The patient was admitted and went to the operating room  with performance of the procedure.  Postop the patient did very well and  went to the recovery room and the floor.  On the floor the patient was  afebrile with stable vital signs and doing great with no leg pain.  The  patient was maintained on flat bed rest secondary to a spinal fluid leak for  three days postoperatively, and the patient continued to improve.  Did have  some postop ileus, and this progressively improved over the several days  following.  She had some difficulty with pain control in her back, and she  was discontinued off her morphine PCA and changed over to Demerol also  secondary to ileus;  however, over the next couple of days the patient  continued to improve, remained afebrile with stable vital signs,  neurologically stable.  By day #3, the patient was able to be progressively  mobilized, ambulated well with a walker and physical therapy, and was able  to be discharged to [facility] and rehab on transfer.  Maintained on intermittent  Demerol and Percocet for pain, and the patient was discharged to rehab,  scheduled to follow up in two weeks.                                               [name], M.D.    GC/MEDQ  D:  02/02/2003  T:  02/02/2003  Job:  [id number]
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
## 2011-04-18 NOTE — Discharge Summary (Signed)
NAMESALAM, [name]             ACCOUNT NO.:  [id number]   MEDICAL RECORD NO.:  [id number]          PATIENT TYPE:  INP   LOCATION:  9130                          FACILITY[facility]  [facility]   PHYSICIAN:  Charles A. Delcambre, MDDATE OF BIRTH:  06-Dec-1980   DATE OF ADMISSION:  05/09/2005  DATE OF DISCHARGE:  05/12/2005                                 DISCHARGE SUMMARY   PRIMARY DISCHARGE DIAGNOSES:  1.  Intrauterine pregnancy 38 weeks.  2.  Failure to progress.  3.  Arrest of descent.  4.  Occiput transverse.   PROCEDURE:  Primary low transverse cesarean section.   DISPOSITION:  The patient was discharged home to follow up in the office in  two days for discontinuation of staples.  She was given PIH precautions as  well as instructed to call for a temperature greater than 100 degrees, or  incisional redness or drainage, increased pain or bleeding, headaches,  scotomata  , right upper quadrant pain.   DISCHARGE MEDICATIONS:  She was given a prescription for:  1.  Percocet 5/325, 1-2 p.o. q.3h. p.r.n. #40.  2.  Hemocyte one p.o. every day, #30.   DISCHARGE INSTRUCTIONS:  1.  She was to convalesce at home.  2.  No driving for two weeks.  3.  No bathing for two weeks, shower okay.  4.  No heavy lifting greater than 30 pounds for one month.   LABORATORY:  Postoperative hematocrit 28.2, hemoglobin 9.4.  PIH labs  otherwise within normal limits.  Placenta returned with mild acute  chorioamnionitis.   OPERATIVE FINDINGS:  Vigorous female, Apgar's 8 and 9.  Cord venous blood gas  7.37.  Cord arterial blood gas was clotted.  Baby weighed 3280 grams.   HOSPITAL COURSE:  The patient was admitted, underwent Pitocin induction,  artifical rupture of membranes was done.  She progressed onto complete  dilation.  Pushed for two hours and 15 minutes to exhaustion.  She had no  significant change with pushing.  Operative intervention for vaginal  delivery was not recommended secondary to station +1 and  occiput bump  palpable on the baby's head felt secondary to arrest and pelvic dystocia.  Recommendation was for cesarean section.  The patient gave informed consent  and we proceeded with a cesarean section.  Postoperatively, she had routine  postoperative course with the exception of mild blood pressure increase of a  labile nature.  She had a temperature of 101 on post-op day #1 as well with  a fever noted at the time of delivery as I recall cefoxitin  was continued  four additional doses postpartum.  She thereafter remained afebrile.  Blood  pressures did remain in the 140/80, occasionally 150/88 type of range.  PIH  labs were negative.  She voided without  difficulty, tolerated a general diet, pain was controlled.  Morning of  discharge, she did have 50/90, repeat blood pressure was 147/89 with normal  labs again checked.  She was discharged home for further followup in the  office with precautions as noted above.       CAD/MEDQ  D:  06/04/2005  T:  06/04/2005  Job:  [id number]

## 2011-04-18 NOTE — Op Note (Signed)
Andrea, [name]             ACCOUNT NO.:  [id number]   MEDICAL RECORD NO.:  [id number]          PATIENT TYPE:  INP   LOCATION:  9130                          FACILITY[facility]  [facility]   PHYSICIAN:  Charles A. Delcambre, MDDATE OF BIRTH:  [birth year]   DATE OF PROCEDURE:  05/09/2005  DATE OF DISCHARGE:                                 OPERATIVE REPORT   PREOPERATIVE DIAGNOSES:  1.  Intrauterine pregnancy, 38 weeks.  2.  Failure to progress.  3.  Arrest of descent.  4.  Occiput transverse.   POSTOPERATIVE DIAGNOSES:  1.  Intrauterine pregnancy, 38 weeks.  2.  Failure to progress.  3.  Arrest of descent.  4.  Occiput transverse.  5.  Nuchal cord x1.   DESCRIPTION OF PROCEDURE:  Primary low transverse cesarean section.   SURGEON:  Charles A. Sydnee Cabal, M.D.   COMPLICATIONS:  None.   ESTIMATED BLOOD LOSS:  700 mL.   OPERATIVE FINDINGS:  Nuchal cord x1.  Vigorous female, Apgars 8 and 9.  Cord  venous blood gas 7.37, arterial blood gas clotted.   SPECIMENS:  Placenta to pathology.   ANESTHESIA:  Epidural.   COUNTS:  Instrument, sponge and needle counts correct x2.   DESCRIPTION OF PROCEDURE:  The patient was taken to the operating room and  placed in the supine position.  Epidural was dosed and was adequate.  Sterile prep and drape was undertaken.  Pfannenstiel incision was made with  the knife, carried down to the fascia.  The fascia was incised with the  knife.  Rectus sheath was released superiorly and inferiorly without  difficulty.  Rectus muscle were abundantly dissected in the midline.  The  peritoneum was entered with Metzenbaum scissors without damage to bowel,  bladder or vascular structures.  Traction was used to extend the peritoneal  incision.  Bladder blade was placed.  Vesicouterine peritoneum was incised  with Metzenbaum scissors.  Blunt dissection was used to develop the bladder  flap.  The bladder flap was replaced and lower uterine segment transverse  incision  was made with amniotomy without damage to the infant.  Traction was  used to extend the incision.  The hand was inserted.   Infant was lifted out of the pelvis occiput to the uterine incision.  Fundal  pressure was applied by the operation assistant and the infant was delivered  without difficulty.  Nuchal cord was reduced for delivery.  Cord was  clamped.  Infant was shown to the parents and handed off to the  neonatologist in attendance.  The placenta was manually expressed after cord  blood and cord gases were taken.  Cord gas for arterial specimen was  difficult to achieve.  Small amount of blood was taken only.   The uterus was exteriorized for repair.  Internal surface was wiped clean  with a moistened lap.  Uterus had a laceration down the left angle.  This  was carefully closed with #1 chromic separately.  The  small arterial  bleeder was closed with figure-of-eight 2-0 Vicryl without injury to the  surrounding structures.  The uterus was enclosed in a  single layer with #1  chromic running locking suture.  The lower uterine segment beneath the  bladder flap hemostasis was achieved with two figure-of-eight sutures of 2-0  Vicryl with good hemostasis resulting.  The bladder flap hemostasis was  excellent.  Irrigation was carried out.  Bladder was intact and no evidence  of injury to the bladder was noted.  Uterine incision and hemostasis was  excellent.  Uterus was reinternalized into the intraperitoneal cavity.  Subfascial hemostasis was excellent.  The fascia was then closed with #1  Vicryl running nonlocking suture.  Hemostasis was excellent.  Skin was then  closed with skin clips and sterile dressing was applied.  The patient was  taken to the recovery room with physician in attendance, having tolerated  the procedure well.       CAD/MEDQ  D:  05/09/2005  T:  05/10/2005  Job:  [id number]

## 2011-04-18 NOTE — Consult Note (Signed)
NAME:  [name], [name] NO.:  [id number]   MEDICAL RECORD NO.:  [id number]          PATIENT TYPE:  MAT   LOCATION:  MATC                          FACILITY:  [facility]   PHYSICIAN:  Juan H. Lily Peer, M.D.DATE OF BIRTH:  01-01-1981   DATE OF CONSULTATION:  10/25/2004  DATE OF DISCHARGE:                                   CONSULTATION   HISTORY OF PRESENT ILLNESS:  The patient is a 30 year old gravida 1, para 0  who stated that her last menstrual period was what she thought was July 25, 2004, which would place her currently at 12.[redacted] weeks gestation.  The  patient presented to [facility] complaining of lower abdominal  discomfort and pulling sensation.  No vaginal discharge per se.  No fever,  no chills, no nausea, vomiting.  No problems with bowel movements or  urination.   PHYSICAL EXAMINATION:  VITAL SIGNS:  Her vital signs when she came in were  temperature 98.6, pulse 105, respirations 20, blood pressure 124/62.  GENERAL:  The patient did not appear to be in any acute distress.  ABDOMEN:  Soft, nontender, although the discomfort that she experienced was  able to be reproduced by laterally attempting to displace the uterus, which  was now 12.5 weeks, and some discomfort was noted, but no rebound or  guarding.  PELVIC:  Essentially unremarkable.  No cervical motion tenderness.  The  cervix was long, closed, and posteriorly.  Fetal heart tones were  appreciated.   LABORATORY DATA:  A urinalysis was done, which demonstrated a specific  gravity of 1.025, and all other parameters were negative.   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  She was content to hear the baby's heart tones.  She has  a follow up appointment with Dr. Sydnee Cabal in the next several weeks, and  she will follow accordingly.  She will continue her prenatal vitamins, and  return to the emergency room or the office on a p.r.n. basis.   Otherwise, a  routine prenatal visit with Dr. Sydnee Cabal.     Juan   JHF/MEDQ  D:  10/25/2004  T:  10/25/2004  Job:  [id number]   cc:   Leonette Most A. Sydnee Cabal, MD  Fax: [phone]
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
## 2011-04-18 NOTE — Consult Note (Signed)
   [name], [name]                         ACCOUNT NO.:  [id number]   MEDICAL RECORD NO.:  [id number]                   PATIENT TYPE:  AMB   LOCATION:  ENDO                                 FACILITY:  [facility]   PHYSICIAN:  John C. Madilyn Fireman, M.D.                 DATE OF BIRTH:  03/14/1981   DATE OF CONSULTATION:  05/18/2003  DATE OF DISCHARGE:                                   CONSULTATION   REFERRING PHYSICIAN:  Georgann Housekeeper, M.D.   REASON FOR CONSULTATION:  GI bleeding.   HISTORY OF PRESENT ILLNESS:  The patient is a 30 year old white female who  noted weakness two mornings ago and then developed abdominal discomfort and  noted black stool on the same day.  She had one or two black stools the days  since and continued to have diffuse periumbilical abdominal pain without  nausea, vomiting, or maroon or bloody stools.  She does not use any  nonsteroidal anti-inflammatory drugs.  She went to see Georgann Housekeeper, M.D.  and had a hemoglobin of 11.6 yesterday.  We discussed her case and had her  come in today for EGD and other evaluation and hemoglobin today is 10.6.   PAST MEDICAL HISTORY:  Depression and panic disorder.   PAST SURGICAL HISTORY:  None.   ALLERGIES:  None.   MEDICATIONS:  1. Prozac.  2. Klonopin.   FAMILY HISTORY:  Noncontributory.   SOCIAL HISTORY:  The patient is married.  She works at [employer].  Denies alcohol or tobacco use.   PHYSICAL EXAMINATION:  GENERAL:  Well-developed, well-nourished white female  in no acute distress.  HEART:  Regular rate and rhythm without murmur.  LUNGS:  Clear.  HEENT:  Unremarkable.  ABDOMEN:  Soft, nontender with normoactive bowel sounds.  No  hepatosplenomegaly, mass, or guarding.  There is mild diffuse tenderness,  particularly in the epigastrium and periumbilical area.  RECTAL:  Heme-positive by Georgann Housekeeper, M.D. yesterday.    IMPRESSION:  Gastrointestinal bleeding with upper tract source suggested.   PLAN:   Will proceed with EGD today.                                               John C. Madilyn Fireman, M.D.    JCH/MEDQ  D:  05/18/2003  T:  05/18/2003  Job:  [id number]   cc:   Georgann Housekeeper, M.D.  [address]  [place]  [place] [address]  Fax: [phone]

## 2011-04-18 NOTE — Assessment & Plan Note (Signed)
[facility]                         ELECTROPHYSIOLOGY OFFICE NOTE   [name], [name]                    MRN:          [id number]  DATE:11/05/2006                            DOB:          [birth year]    [name] was seen today in the clinic on November 05, 2006, for a wound  check of her newly implanted Medtronic model number [id number] [id number].  Date  of implant was October 12, 2006, for syncope and long QT.  On  interrogation of her device today, her battery voltage was 3.23 with a  charge time of 8.83 seconds.  R waves measured 6.1 millivolts with a  ventricular pacing threshold of 1 volt at 0.7 milliseconds and an  ventricular lead impedance of 728 ohms.  Shock impedance was 72.  There  were no episodes since implant date.  I did program onset and no other  changes.  She will be seen again in 3 months' time.  Her Steri-Strips  had already been removed.  Her wound is without redness or edema.      Altha Harm, LPN  Electronically Signed      Duke Salvia, MD, [facility]  Electronically Signed   PO/MedQ  DD: 11/05/2006  DT: 11/05/2006  Job #: [id number]

## 2011-04-22 ENCOUNTER — Other Ambulatory Visit: Payer: Self-pay

## 2011-04-22 ENCOUNTER — Ambulatory Visit (INDEPENDENT_AMBULATORY_CARE_PROVIDER_SITE_OTHER): Payer: Managed Care, Other (non HMO) | Admitting: *Deleted

## 2011-04-22 DIAGNOSIS — I4581 Long QT syndrome: Secondary | ICD-10-CM

## 2011-05-08 ENCOUNTER — Encounter: Payer: Self-pay | Admitting: *Deleted

## 2011-06-05 NOTE — Progress Notes (Signed)
ICD remote 

## 2011-07-24 ENCOUNTER — Encounter: Payer: Managed Care, Other (non HMO) | Admitting: *Deleted

## 2011-07-31 ENCOUNTER — Encounter: Payer: Self-pay | Admitting: *Deleted

## 2011-09-20 ENCOUNTER — Encounter: Payer: Self-pay | Admitting: Internal Medicine

## 2011-09-20 ENCOUNTER — Other Ambulatory Visit: Payer: Self-pay | Admitting: Internal Medicine

## 2011-09-22 ENCOUNTER — Ambulatory Visit (INDEPENDENT_AMBULATORY_CARE_PROVIDER_SITE_OTHER): Payer: Managed Care, Other (non HMO) | Admitting: *Deleted

## 2011-09-22 DIAGNOSIS — I4581 Long QT syndrome: Secondary | ICD-10-CM

## 2011-09-22 DIAGNOSIS — R55 Syncope and collapse: Secondary | ICD-10-CM

## 2011-10-03 NOTE — Progress Notes (Signed)
icd remote check  

## 2011-10-21 ENCOUNTER — Encounter: Payer: Self-pay | Admitting: *Deleted

## 2011-12-07 ENCOUNTER — Encounter: Payer: Self-pay | Admitting: *Deleted

## 2011-12-07 ENCOUNTER — Emergency Department (HOSPITAL_COMMUNITY)
Admission: EM | Admit: 2011-12-07 | Discharge: 2011-12-07 | Disposition: A | Discharge status: Home / Self Care | Payer: Managed Care, Other (non HMO) | Attending: Emergency Medicine | Admitting: Emergency Medicine

## 2011-12-07 ENCOUNTER — Emergency Department (HOSPITAL_COMMUNITY): Payer: Managed Care, Other (non HMO)

## 2011-12-07 DIAGNOSIS — R509 Fever, unspecified: Secondary | ICD-10-CM | POA: Insufficient documentation

## 2011-12-07 DIAGNOSIS — R110 Nausea: Secondary | ICD-10-CM | POA: Insufficient documentation

## 2011-12-07 DIAGNOSIS — H53149 Visual discomfort, unspecified: Secondary | ICD-10-CM | POA: Insufficient documentation

## 2011-12-07 DIAGNOSIS — R51 Headache: Secondary | ICD-10-CM

## 2011-12-07 DIAGNOSIS — Z95810 Presence of automatic (implantable) cardiac defibrillator: Secondary | ICD-10-CM | POA: Insufficient documentation

## 2011-12-07 MED ORDER — METOCLOPRAMIDE HCL 5 MG/ML IJ SOLN
10.0000 mg | Freq: Once | INTRAMUSCULAR | Status: AC
  Administered 2011-12-07: 10 mg via INTRAVENOUS
  Filled 2011-12-07: qty 2

## 2011-12-07 MED ORDER — SODIUM CHLORIDE 0.9 % IV BOLUS (SEPSIS)
500.0000 mL | Freq: Once | INTRAVENOUS | Status: AC
  Administered 2011-12-07: 500 mL via INTRAVENOUS

## 2011-12-07 NOTE — ED Notes (Signed)
Pt reports having head pressure x 2 months.  Was treated with prednisone, antibiotics for sinusitis without relief.  Reports that the pain has increased tonight.  No distress, pt ambulatory.  Photophobia.

## 2011-12-07 NOTE — ED Notes (Signed)
Pt states reglan reduced pain to 8/10.  Dr Rubin Payor notified.  Await CT.

## 2011-12-07 NOTE — ED Notes (Signed)
Pt to ED c/o L temporal pain that radiates down L cheek.  Pt recently finished antibiotics with no improvement.  Pt photophobic.  Pt appears in great pain.  Pain is a pressure and resembles her many frequent sinus infections, though is much worse.

## 2011-12-07 NOTE — ED Provider Notes (Signed)
History     CSN: [id number]  Arrival date & time 12/07/11  0239   First MD Initiated Contact with Patient 12/07/11 [phone]      Chief Complaint  Patient presents with  . Headache    (Consider location/radiation/quality/duration/timing/severity/associated sxs/prior treatment) Patient is a 31 y.o. female presenting with headaches. The history is provided by the patient.  Headache  Associated symptoms include nausea. Pertinent negatives include no shortness of breath and no vomiting.   patient states she's had a headache since around Christmastime. States it is on the front of her head and feels a shooting stabbing. She states that she had a fever one started. She states she saw urgent care she was given prednisone antibiotics. She states it is not improved. She is nauseous this without vomiting. No diarrhea. The light bothers her. No vision changes. She states she is supposed to wear glasses. No nasal drainage now. She states when she tries she does have some from her nose so. She has an AICD for prolonged QT syndrome.  History reviewed. No pertinent past medical history.  Past Surgical History  Procedure Date  . Cardiac defibrillator placement     History reviewed. No pertinent family history.  History  Substance Use Topics  . Smoking status: Never Smoker   . Smokeless tobacco: Not on file  . Alcohol Use: No    OB History    Grav Para Term Preterm Abortions TAB SAB Ect Mult Living                  Review of Systems  Constitutional: Negative for activity change and appetite change.  HENT: Negative for neck stiffness.   Eyes: Positive for photophobia. Negative for pain.  Respiratory: Negative for chest tightness and shortness of breath.   Cardiovascular: Negative for chest pain and leg swelling.  Gastrointestinal: Positive for nausea. Negative for vomiting, abdominal pain and diarrhea.  Genitourinary: Negative for flank pain.  Musculoskeletal: Negative for back pain.    Skin: Negative for rash.  Neurological: Positive for headaches. Negative for weakness and numbness.  Psychiatric/Behavioral: Negative for behavioral problems.    Allergies  Dilaudid and Sulfonamide derivatives  Home Medications   Current Outpatient Rx  Name Route Sig Dispense Refill  . ACETAMINOPHEN 500 MG PO TABS Oral Take 1,000 mg by mouth every 6 (six) hours as needed. For pain     . ALPRAZOLAM 1 MG PO TABS Oral Take 1 mg by mouth at bedtime.      . FLUOXETINE HCL 40 MG PO CAPS Oral Take 40 mg by mouth 3 (three) times daily.      . GUAIFENESIN ER 600 MG PO TB12 Oral Take 1,200 mg by mouth 2 (two) times daily.      . GUAIFENESIN-CODEINE 100-10 MG/5ML PO SYRP Oral Take 5-10 mLs by mouth every 4 (four) hours as needed. For cough     . NADOLOL 20 MG PO TABS Oral Take 20 mg by mouth daily.        BP 92/68  Pulse 75  Temp(Src) 97.5 F (36.4 C) (Oral)  Resp 16  SpO2 97%  Physical Exam  Nursing note and vitals reviewed. Constitutional: She is oriented to person, place, and time. She appears well-developed and well-nourished.  HENT:  Head: Normocephalic and atraumatic.  Eyes: EOM are normal. Pupils are equal, round, and reactive to light.  Neck: Normal range of motion. Neck supple.  Cardiovascular: Normal rate, regular rhythm and normal heart sounds.   No murmur  heard. Pulmonary/Chest: Effort normal and breath sounds normal. No respiratory distress. She has no wheezes. She has no rales.       AICD left chest wall  Abdominal: Soft. Bowel sounds are normal. She exhibits no distension. There is no tenderness. There is no rebound and no guarding.  Musculoskeletal: Normal range of motion.  Neurological: She is alert and oriented to person, place, and time. No cranial nerve deficit.  Skin: Skin is warm and dry.  Psychiatric: She has a normal mood and affect. Her speech is normal.    ED Course  Procedures (including critical care time)  Labs Reviewed - No data to display Ct Head  Wo Contrast  12/07/2011  *RADIOLOGY REPORT*  Clinical Data: Headache  CT HEAD WITHOUT CONTRAST  Technique:  Contiguous axial images were obtained from the base of the skull through the vertex without contrast.  Comparison: None.  Findings: There is no evidence for acute hemorrhage, hydrocephalus, mass lesion, or abnormal extra-axial fluid collection.  No definite CT evidence for acute infarction.  The visualized paranasal sinuses and mastoid air cells are predominately clear.  IMPRESSION: No acute intracranial abnormality.  Original Report Authenticated By: Waneta Martins, M.D.     1. Headache       MDM  Headache for the last 2 months on and off. Initially treated as sinusitis by urgent care. No improvement. CT was done here and was negative. Patient feels better after Reglan. She be discharged home to followup as needed        Juliet Rude. Rubin Payor, MD 12/07/11 [phone]

## 2011-12-07 NOTE — ED Notes (Signed)
PERRLA.

## 2011-12-07 NOTE — ED Notes (Signed)
CT called and they stated they are ready and will come for the pt.

## 2012-01-27 ENCOUNTER — Encounter: Payer: Managed Care, Other (non HMO) | Admitting: Internal Medicine

## 2012-01-29 ENCOUNTER — Ambulatory Visit: Payer: Managed Care, Other (non HMO) | Admitting: Physician Assistant

## 2012-10-29 ENCOUNTER — Encounter: Payer: Self-pay | Admitting: *Deleted

## 2012-10-29 DIAGNOSIS — Z95810 Presence of automatic (implantable) cardiac defibrillator: Secondary | ICD-10-CM | POA: Insufficient documentation

## 2012-11-19 ENCOUNTER — Encounter: Payer: Self-pay | Admitting: Internal Medicine

## 2012-12-09 ENCOUNTER — Encounter: Payer: Self-pay | Admitting: *Deleted

## 2013-03-02 ENCOUNTER — Telehealth: Payer: Self-pay | Admitting: Internal Medicine

## 2013-03-02 NOTE — Telephone Encounter (Signed)
03-02-13 lmm @ 250pm for pt to call to set up past due pacer ck, certified letter mail rtn/mt

## 2014-06-15 ENCOUNTER — Encounter: Payer: Self-pay | Admitting: Internal Medicine

## 2015-04-25 ENCOUNTER — Encounter: Payer: Managed Care, Other (non HMO) | Admitting: Internal Medicine

## 2015-05-07 ENCOUNTER — Encounter: Payer: Self-pay | Admitting: Internal Medicine

## 2016-03-29 ENCOUNTER — Encounter (HOSPITAL_COMMUNITY): Payer: Self-pay | Admitting: Nurse Practitioner

## 2016-03-29 ENCOUNTER — Emergency Department (HOSPITAL_COMMUNITY)
Admission: EM | Admit: 2016-03-29 | Discharge: 2016-03-29 | Disposition: A | Discharge status: Home / Self Care | Payer: Managed Care, Other (non HMO) | Attending: Emergency Medicine | Admitting: Emergency Medicine

## 2016-03-29 ENCOUNTER — Encounter: Payer: Self-pay | Admitting: Internal Medicine

## 2016-03-29 DIAGNOSIS — Z45010 Encounter for checking and testing of cardiac pacemaker pulse generator [battery]: Secondary | ICD-10-CM | POA: Diagnosis present

## 2016-03-29 DIAGNOSIS — Z8639 Personal history of other endocrine, nutritional and metabolic disease: Secondary | ICD-10-CM | POA: Insufficient documentation

## 2016-03-29 DIAGNOSIS — Z8619 Personal history of other infectious and parasitic diseases: Secondary | ICD-10-CM | POA: Insufficient documentation

## 2016-03-29 DIAGNOSIS — Z8781 Personal history of (healed) traumatic fracture: Secondary | ICD-10-CM | POA: Diagnosis not present

## 2016-03-29 DIAGNOSIS — Z87440 Personal history of urinary (tract) infections: Secondary | ICD-10-CM | POA: Diagnosis not present

## 2016-03-29 DIAGNOSIS — F410 Panic disorder [episodic paroxysmal anxiety] without agoraphobia: Secondary | ICD-10-CM | POA: Diagnosis not present

## 2016-03-29 DIAGNOSIS — Z79899 Other long term (current) drug therapy: Secondary | ICD-10-CM | POA: Diagnosis not present

## 2016-03-29 DIAGNOSIS — Z4502 Encounter for adjustment and management of automatic implantable cardiac defibrillator: Secondary | ICD-10-CM

## 2016-03-29 DIAGNOSIS — Z87448 Personal history of other diseases of urinary system: Secondary | ICD-10-CM | POA: Diagnosis not present

## 2016-03-29 DIAGNOSIS — F319 Bipolar disorder, unspecified: Secondary | ICD-10-CM | POA: Insufficient documentation

## 2016-03-29 NOTE — ED Notes (Signed)
Pt reports he defibrillator has been making an alarm noise all week. she denies pain, sob, dizziness, other complaints. She is alert and breathing easily
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
## 2016-06-12 ENCOUNTER — Encounter: Payer: Self-pay | Admitting: Internal Medicine

## 2016-06-12 ENCOUNTER — Ambulatory Visit (INDEPENDENT_AMBULATORY_CARE_PROVIDER_SITE_OTHER): Payer: Managed Care, Other (non HMO) | Admitting: Internal Medicine

## 2016-06-12 ENCOUNTER — Encounter: Payer: Self-pay | Admitting: *Deleted

## 2016-06-12 VITALS — BP 110/60 | HR 58 | Ht 64.0 in | Wt 151.8 lb

## 2016-06-12 DIAGNOSIS — I4581 Long QT syndrome: Secondary | ICD-10-CM

## 2016-06-12 DIAGNOSIS — Z01812 Encounter for preprocedural laboratory examination: Secondary | ICD-10-CM

## 2016-06-12 DIAGNOSIS — R55 Syncope and collapse: Secondary | ICD-10-CM
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
## 2016-06-12 NOTE — Patient Instructions (Addendum)
Medication Instructions: - Your physician recommends that you continue on your current medications as directed. Please refer to the Current Medication list given to you today.  Labwork: - see procedure sheet  Procedures/Testing: - Your physician has recommended that you have a defibrillator generator (battery) change. Please see the instruction sheet given to you today for more information.  Follow-Up: - Your physician recommends that you schedule a follow-up appointment in: 10-14 days for a wound check (from 07/02/16) with the Device Clinic.  Any Additional Special Instructions Will Be Listed Below (If Applicable). - I will forward your information to Dr[facility] Sidney [facility]Sumy [facility]- she should be in touch with you to discuss genetic testing    If you need a refill on your cardiac medications before your next appointment, please call your pharmacy.

## 2016-06-13 ENCOUNTER — Other Ambulatory Visit: Payer: Self-pay | Admitting: Internal Medicine

## 2016-06-13 LAB — CUP PACEART INCLINIC DEVICE CHECK
Battery Voltage: 2.62 V
Brady Statistic RV Percent Paced: 0 %
Date Time Interrogation Session: 20170713181255
HighPow Impedance: 100 Ohm
HighPow Impedance: 102 Ohm
HighPow Impedance: 102 Ohm
HighPow Impedance: 58 Ohm
HighPow Impedance: 85 Ohm
HighPow Impedance: 86 Ohm
HighPow Impedance: 88 Ohm
HighPow Impedance: 88 Ohm
HighPow Impedance: 88 Ohm
HighPow Impedance: 90 Ohm
HighPow Impedance: 90 Ohm
HighPow Impedance: 94 Ohm
HighPow Impedance: 94 Ohm
HighPow Impedance: 94 Ohm
HighPow Impedance: 94 Ohm
HighPow Impedance: 98 Ohm
Implantable Lead Implant Date: 20071112
Implantable Lead Location: 753860
Implantable Lead Model: 7002
Lead Channel Impedance Value: 544 Ohm
Lead Channel Impedance Value: 544 Ohm
Lead Channel Impedance Value: 544 Ohm
Lead Channel Impedance Value: 544 Ohm
Lead Channel Impedance Value: 552 Ohm
Lead Channel Impedance Value: 552 Ohm
Lead Channel Impedance Value: 552 Ohm
Lead Channel Impedance Value: 560 Ohm
Lead Channel Impedance Value: 560 Ohm
Lead Channel Impedance Value: 568 Ohm
Lead Channel Impedance Value: 568 Ohm
Lead Channel Impedance Value: 584 Ohm
Lead Channel Impedance Value: 592 Ohm
Lead Channel Impedance Value: 608 Ohm
Lead Channel Impedance Value: 632 Ohm
Lead Channel Sensing Intrinsic Amplitude: 6.8 mV
Lead Channel Sensing Intrinsic Amplitude: 7 mV
Lead Channel Sensing Intrinsic Amplitude: 7.1 mV
Lead Channel Sensing Intrinsic Amplitude: 7.1 mV
Lead Channel Sensing Intrinsic Amplitude: 7.2 mV
Lead Channel Sensing Intrinsic Amplitude: 7.2 mV
Lead Channel Sensing Intrinsic Amplitude: 7.2 mV
Lead Channel Sensing Intrinsic Amplitude: 7.2 mV
Lead Channel Sensing Intrinsic Amplitude: 7.4 mV
Lead Channel Sensing Intrinsic Amplitude: 7.4 mV
Lead Channel Sensing Intrinsic Amplitude: 7.5 mV
Lead Channel Sensing Intrinsic Amplitude: 7.7 mV
Lead Channel Sensing Intrinsic Amplitude: 7.7 mV
Lead Channel Sensing Intrinsic Amplitude: 7.9 mV
Lead Channel Sensing Intrinsic Amplitude: 8.2 mV
Lead Channel Setting Pacing Amplitude: 4 V
Lead Channel Setting Pacing Pulse Width: 0.4 ms
Lead Channel Setting Sensing Sensitivity: 0.3 mV

## 2016-06-25 ENCOUNTER — Other Ambulatory Visit (INDEPENDENT_AMBULATORY_CARE_PROVIDER_SITE_OTHER): Payer: Managed Care, Other (non HMO)

## 2016-06-25 DIAGNOSIS — I4581 Long QT syndrome: Secondary | ICD-10-CM | POA: Diagnosis not present

## 2016-06-25 DIAGNOSIS — Z01812 Encounter for preprocedural laboratory examination: Secondary | ICD-10-CM

## 2016-06-25 LAB — CBC WITH DIFFERENTIAL/PLATELET
Basophils Absolute: 81 cells/uL (ref 0–200)
Basophils Relative: 1 %
Eosinophils Absolute: 405 cells/uL (ref 15–500)
Eosinophils Relative: 5 %
HCT: 38.9 % (ref 35.0–45.0)
Hemoglobin: 13 g/dL (ref 11.7–15.5)
Lymphocytes Relative: 31 %
Lymphs Abs: 2511 cells/uL (ref 850–3900)
MCH: 30.6 pg (ref 27.0–33.0)
MCHC: 33.4 g/dL (ref 32.0–36.0)
MCV: 91.5 fL (ref 80.0–100.0)
MPV: 9.3 fL (ref 7.5–12.5)
Monocytes Absolute: 486 cells/uL (ref 200–950)
Monocytes Relative: 6 %
Neutro Abs: 4617 cells/uL (ref 1500–7800)
Neutrophils Relative %: 57 %
Platelets: 322 10*3/uL (ref 140–400)
RBC: 4.25 MIL/uL (ref 3.80–5.10)
RDW: 13.7 % (ref 11.0–15.0)
WBC: 8.1 10*3/uL (ref 3.8–10.8)

## 2016-06-25 LAB — BASIC METABOLIC PANEL
BUN: 5 mg/dL — ABNORMAL LOW (ref 7–25)
CO2: 23 mmol/L (ref 20–31)
Calcium: 8.9 mg/dL (ref 8.6–10.2)
Chloride: 106 mmol/L (ref 98–110)
Creat: 0.85 mg/dL (ref 0.50–1.10)
Glucose, Bld: 96 mg/dL (ref 65–99)
Potassium: 4.2 mmol/L (ref 3.5–5.3)
Sodium: 139 mmol/L (ref 135–146)

## 2016-06-25 LAB — PROTIME-INR
INR: 0.9
Prothrombin Time: 10.1 s (ref 9.0–11.5)

## 2016-07-02 ENCOUNTER — Ambulatory Visit (HOSPITAL_COMMUNITY)
Admission: RE | Admit: 2016-07-02 | Discharge: 2016-07-02 | Disposition: A | Discharge status: Home / Self Care | Payer: Managed Care, Other (non HMO) | Source: Ambulatory Visit | Attending: Internal Medicine | Admitting: Internal Medicine

## 2016-07-02 ENCOUNTER — Encounter (HOSPITAL_COMMUNITY)
Admission: RE | Disposition: A | Discharge status: Home / Self Care | Payer: Self-pay | Source: Ambulatory Visit | Attending: Internal Medicine

## 2016-07-02 ENCOUNTER — Encounter (HOSPITAL_COMMUNITY): Payer: Self-pay | Admitting: Cardiology

## 2016-07-02 DIAGNOSIS — F319 Bipolar disorder, unspecified: Secondary | ICD-10-CM | POA: Diagnosis not present

## 2016-07-02 DIAGNOSIS — F419 Anxiety disorder, unspecified: Secondary | ICD-10-CM | POA: Diagnosis not present

## 2016-07-02 DIAGNOSIS — J309 Allergic rhinitis, unspecified: Secondary | ICD-10-CM | POA: Insufficient documentation

## 2016-07-02 DIAGNOSIS — I469 Cardiac arrest, cause unspecified: Secondary | ICD-10-CM

## 2016-07-02 DIAGNOSIS — R55 Syncope and collapse: Secondary | ICD-10-CM | POA: Insufficient documentation

## 2016-07-02 DIAGNOSIS — Z95810 Presence of automatic (implantable) cardiac defibrillator: Secondary | ICD-10-CM | POA: Diagnosis present

## 2016-07-02 DIAGNOSIS — Z8781 Personal history of (healed) traumatic fracture: Secondary | ICD-10-CM | POA: Insufficient documentation

## 2016-07-02 DIAGNOSIS — E860 Dehydration: Secondary | ICD-10-CM | POA: Diagnosis not present

## 2016-07-02 DIAGNOSIS — E876 Hypokalemia: Secondary | ICD-10-CM | POA: Insufficient documentation

## 2016-07-02 DIAGNOSIS — Z4502 Encounter for adjustment and management of automatic implantable cardiac defibrillator: Secondary | ICD-10-CM | POA: Diagnosis not present

## 2016-07-02 DIAGNOSIS — Z882 Allergy status to sulfonamides status: Secondary | ICD-10-CM | POA: Diagnosis not present

## 2016-07-02 DIAGNOSIS — N10 Acute tubulo-interstitial nephritis: Secondary | ICD-10-CM | POA: Insufficient documentation

## 2016-07-02 DIAGNOSIS — I4581 Long QT syndrome: Secondary | ICD-10-CM

## 2016-07-02 HISTORY — PX: EP IMPLANTABLE DEVICE: SHX172B

## 2016-07-02 LAB — PREGNANCY, URINE: Preg Test, Ur: NEGATIVE

## 2016-07-02 LAB — SURGICAL PCR SCREEN
MRSA, PCR: NEGATIVE
Staphylococcus aureus: NEGATIVE

## 2016-07-02 SURGERY — ICD/BIV ICD GENERATOR CHANGEOUT

## 2016-07-02 MED ORDER — MUPIROCIN 2 % EX OINT
1.0000 "application " | TOPICAL_OINTMENT | Freq: Once | CUTANEOUS | Status: DC
  Filled 2016-07-02: qty 22

## 2016-07-02 MED ORDER — SODIUM CHLORIDE 0.9 % IR SOLN
Status: AC
  Filled 2016-07-02: qty 2

## 2016-07-02 MED ORDER — LIDOCAINE HCL (PF) 1 % IJ SOLN
INTRAMUSCULAR | Status: AC
  Filled 2016-07-02: qty 30

## 2016-07-02 MED ORDER — SODIUM CHLORIDE 0.9 % IV SOLN
INTRAVENOUS | Status: DC
  Administered 2016-07-02: 07:00:00 via INTRAVENOUS

## 2016-07-02 MED ORDER — MIDAZOLAM HCL 5 MG/5ML IJ SOLN
INTRAMUSCULAR | Status: AC
  Filled 2016-07-02: qty 5

## 2016-07-02 MED ORDER — SODIUM CHLORIDE 0.9 % IV SOLN: INTRAVENOUS | Status: AC

## 2016-07-02 MED ORDER — ONDANSETRON HCL 4 MG/2ML IJ SOLN: 4.0000 mg | Freq: Four times a day (QID) | INTRAMUSCULAR | Status: DC | PRN

## 2016-07-02 MED ORDER — MIDAZOLAM HCL 5 MG/5ML IJ SOLN
INTRAMUSCULAR | Status: DC | PRN
  Administered 2016-07-02 (×3): 1 mg via INTRAVENOUS
  Administered 2016-07-02: 2 mg via INTRAVENOUS

## 2016-07-02 MED ORDER — ACETAMINOPHEN 325 MG PO TABS
325.0000 mg | ORAL_TABLET | ORAL | Status: DC | PRN
  Filled 2016-07-02: qty 2

## 2016-07-02 MED ORDER — LIDOCAINE HCL (PF) 1 % IJ SOLN
INTRAMUSCULAR | Status: DC | PRN
  Administered 2016-07-02: 40 mL via SUBCUTANEOUS

## 2016-07-02 MED ORDER — FENTANYL CITRATE (PF) 100 MCG/2ML IJ SOLN
INTRAMUSCULAR | Status: AC
Start: 2016-07-02 — End: 2016-07-02
  Filled 2016-07-02: qty 2

## 2016-07-02 MED ORDER — MUPIROCIN 2 % EX OINT
TOPICAL_OINTMENT | CUTANEOUS | Status: AC
  Administered 2016-07-02: 1
  Filled 2016-07-02: qty 22

## 2016-07-02 MED ORDER — SODIUM CHLORIDE 0.9 % IR SOLN
80.0000 mg | Status: AC
  Administered 2016-07-02: 80 mg
  Filled 2016-07-02: qty 2

## 2016-07-02 MED ORDER — HEPARIN (PORCINE) IN NACL 2-0.9 UNIT/ML-% IJ SOLN
INTRAMUSCULAR | Status: AC
  Filled 2016-07-02: qty 1000

## 2016-07-02 MED ORDER — FENTANYL CITRATE (PF) 100 MCG/2ML IJ SOLN
INTRAMUSCULAR | Status: DC | PRN
  Administered 2016-07-02: 50 ug via INTRAVENOUS
  Administered 2016-07-02 (×2): 25 ug via INTRAVENOUS

## 2016-07-02 MED ORDER — CEFAZOLIN SODIUM-DEXTROSE 2-4 GM/100ML-% IV SOLN
2.0000 g | INTRAVENOUS | Status: AC
  Administered 2016-07-02: 2 g via INTRAVENOUS
  Filled 2016-07-02: qty 100

## 2016-07-02 MED ORDER — CEFAZOLIN SODIUM-DEXTROSE 2-4 GM/100ML-% IV SOLN
INTRAVENOUS | Status: AC
  Filled 2016-07-02: qty 100

## 2016-07-02 SURGICAL SUPPLY — 5 items
CABLE SURGICAL S-101-97-12 (CABLE) ×1 IMPLANT
ICD VISIA AF VR DVAB1D1 (ICD Generator) IMPLANT
PAD DEFIB LIFELINK (PAD) ×1 IMPLANT
TRAY PACEMAKER INSERTION (PACKS) ×1 IMPLANT
VISIA AF VR DVAB1D1 (ICD Generator) ×2 IMPLANT

## 2016-07-02 NOTE — Interval H&P Note (Signed)
ICD Criteria  Current LVEF:55%. Within 12 months prior to implant: No   Heart failure history: No  Cardiomyopathy history: No.  Atrial Fibrillation/Atrial Flutter: No.  Ventricular tachycardia history: Yes, Hemodynamic instability present. 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  Cardiac arrest history: Yes, Ventricular Fibrillation.  History of syndromes with risk of sudden death: Yes, Long QT Syndrome  Previous ICD: Yes, Reason for ICD:  Secondary prevention.  Current ICD indication: Secondary  PPM indication: No.   Class I or II Bradycardia indication present: No  Beta Blocker therapy for 3 or more months: Yes, prescribed.   Ace Inhibitor/ARB therapy for 3 or more months: No, medical reason.  History and Physical Interval Note:  07/02/2016 6:39 AM  [name]  has presented today for surgery, with the diagnosis of eri  The various methods of treatment have been discussed with the patient and family. After consideration of risks, benefits and other options for treatment, the patient has consented to  Procedure(s): ICD QUALCOMM (N/A) as a surgical intervention .  The patient's history has been reviewed, patient examined, no change in status, stable for surgery.  I have reviewed the patient's chart and labs.  Questions were answered to the patient's satisfaction.     Sherryl Manges

## 2016-07-02 NOTE — Discharge Instructions (Signed)
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
## 2016-07-03 MED FILL — Heparin Sodium (Porcine) 2 Unit/ML in Sodium Chloride 0.9%: INTRAMUSCULAR | Qty: 1000 | Status: AC

## 2016-07-10 NOTE — Addendum Note (Signed)
Addended by: Reesa ChewJONES, Franz Svec G on: 07/10/2016 05:42 PM   Modules accepted: Orders

## 2016-07-16 ENCOUNTER — Ambulatory Visit (INDEPENDENT_AMBULATORY_CARE_PROVIDER_SITE_OTHER): Payer: Managed Care, Other (non HMO) | Admitting: *Deleted

## 2016-07-16 ENCOUNTER — Encounter (INDEPENDENT_AMBULATORY_CARE_PROVIDER_SITE_OTHER): Payer: Self-pay

## 2016-07-16 ENCOUNTER — Encounter: Payer: Self-pay | Admitting: Internal Medicine

## 2016-07-16 DIAGNOSIS — I4581 Long QT syndrome: Secondary | ICD-10-CM

## 2016-07-16 DIAGNOSIS — Z95810 Presence of automatic (implantable) cardiac defibrillator: Secondary | ICD-10-CM

## 2016-07-16 NOTE — Progress Notes (Signed)
Wound check appointment. Dermabond removed by patient. Wound without redness or edema. Stitch partially removed from right lateral incision, patient unable to tolerate additional attempt to remove remainder due to discomfort. Antibiotic ointment and bandaid applied, patient to return in 1 week for reassessment.  Incision edges otherwise approximated and well healed. Patient aware to call with signs/symptoms of infection in interim.  Normal device function. Thresholds, sensing, and impedances consistent with implant measurements. Device programmed at chronic outputs. Histogram distribution appropriate for patient and level of activity. No AF or ventricular arrhythmias noted. Patient educated about wound care, arm mobility, lifting restrictions, shock plan. Patient to bring Carelink monitor to next appt to update SN. ROV with device clinic on 07/23/16 and with SK in 3 months.

## 2016-07-30 ENCOUNTER — Telehealth: Payer: Self-pay | Admitting: *Deleted

## 2016-07-30 ENCOUNTER — Ambulatory Visit (INDEPENDENT_AMBULATORY_CARE_PROVIDER_SITE_OTHER): Payer: Managed Care, Other (non HMO) | Admitting: *Deleted

## 2016-07-30 DIAGNOSIS — I4581 Long QT syndrome: Secondary | ICD-10-CM

## 2016-07-30 DIAGNOSIS — Z95810 Presence of automatic (implantable) cardiac defibrillator: Secondary | ICD-10-CM

## 2016-07-30 LAB — CUP PACEART INCLINIC DEVICE CHECK
Battery Remaining Longevity: 138 mo
Battery Voltage: 3.17 V
Brady Statistic RV Percent Paced: 0.01 %
Date Time Interrogation Session: 20170816150348
HighPow Impedance: 81 Ohm
Implantable Lead Implant Date: 20071112
Implantable Lead Location: 753860
Implantable Lead Model: 7002
Lead Channel Impedance Value: 437 Ohm
Lead Channel Impedance Value: 551 Ohm
Lead Channel Pacing Threshold Amplitude: 1.75 V
Lead Channel Pacing Threshold Pulse Width: 0.4 ms
Lead Channel Sensing Intrinsic Amplitude: 10.625 mV
Lead Channel Setting Pacing Amplitude: 4.75 V
Lead Channel Setting Pacing Pulse Width: 0.4 ms
Lead Channel Setting Sensing Sensitivity: 0.3 mV

## 2016-07-30 NOTE — Progress Notes (Signed)
Wound recheck in clinic.  ICD not interrogated.  Incision well healed.  Wound without redness or edema.  Able to associate Carelink monitor with assistance from tech services.  ROV with SK on 10/08/16 as scheduled.

## 2016-07-30 NOTE — Telephone Encounter (Addendum)
Called patient to obtain Carelink home monitor SN.  Patient listed SN as [id number], but monitor is unable to be associated automatically.  Advised patient that I will call her back if I am unsuccessful after speaking with tech services.  She is agreeable.  Spoke with tech services and SN is updated.  Test transmission received successfully.

## 2016-09-25 ENCOUNTER — Encounter: Payer: Self-pay | Admitting: Internal Medicine

## 2016-10-08 ENCOUNTER — Encounter (INDEPENDENT_AMBULATORY_CARE_PROVIDER_SITE_OTHER): Payer: Self-pay

## 2016-10-08 ENCOUNTER — Encounter: Payer: Self-pay | Admitting: Internal Medicine

## 2016-10-08 ENCOUNTER — Ambulatory Visit (INDEPENDENT_AMBULATORY_CARE_PROVIDER_SITE_OTHER): Payer: Managed Care, Other (non HMO) | Admitting: Internal Medicine

## 2016-10-08 VITALS — BP 102/60 | HR 52 | Ht 64.0 in | Wt 140.2 lb

## 2016-10-08 DIAGNOSIS — R0602 Shortness of breath: Secondary | ICD-10-CM | POA: Diagnosis not present

## 2016-10-08 DIAGNOSIS — Z95810 Presence of automatic (implantable) cardiac defibrillator: Secondary | ICD-10-CM

## 2016-10-08 DIAGNOSIS — I4581 Long QT syndrome: Secondary | ICD-10-CM | POA: Diagnosis not present

## 2016-10-08 DIAGNOSIS — R079 Chest pain, unspecified: Secondary | ICD-10-CM | POA: Diagnosis not present

## 2016-10-08 NOTE — Patient Instructions (Signed)
Medication Instructions: - Your physician recommends that you continue on your current medications as directed. Please refer to the Current Medication list given to you today.  Labwork: - none ordered  Procedures/Testing: - Your physician has requested that you have an echocardiogram. Echocardiography is a painless test that uses sound waves to create images of your heart. It provides your doctor with information about the size and shape of your heart and how well your heart's chambers and valves are working. This procedure takes approximately one hour. There are no restrictions for this procedure.  - Your physician has requested that you have an exercise tolerance test. For further information please visit [email or web address]. Please also follow instruction sheet, as given.   Follow-Up: - Remote monitoring is used to monitor your Pacemaker of ICD from home. This monitoring reduces the number of office visits required to check your device to one time per year. It allows us to keep an eye on the functioning of your device to ensure it is working properly. You are scheduled for a device check from home on 01/07/17. You may send your transmission at any time that day. If you have a wireless device, the transmission will be sent automatically. After your physician reviews your transmission, you will receive a postcard with your next transmission date.  - Your physician wants you to follow-up in: 9 months with Dr. Graciela HusbandsKlein. You will receive a reminder letter in the mail two months in advance. If you don't receive a letter, please call our office to schedule the follow-up appointment.   Any Additional Special Instructions Will Be Listed Below (If Applicable).     If you need a refill on your cardiac medications before your next appointment, please call your pharmacy. \
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
## 2016-10-09 LAB — CUP PACEART INCLINIC DEVICE CHECK
Battery Remaining Longevity: 137 mo
Battery Voltage: 3.16 V
Brady Statistic RV Percent Paced: 0.01 %
Date Time Interrogation Session: 20171108170304
HighPow Impedance: 88 Ohm
Implantable Lead Implant Date: 20071112
Implantable Lead Location: 753860
Implantable Lead Model: 7002
Implantable Pulse Generator Implant Date: 20170802
Lead Channel Impedance Value: 437 Ohm
Lead Channel Impedance Value: 551 Ohm
Lead Channel Pacing Threshold Amplitude: 1.75 V
Lead Channel Pacing Threshold Pulse Width: 0.4 ms
Lead Channel Sensing Intrinsic Amplitude: 7.125 mV
Lead Channel Sensing Intrinsic Amplitude: 9.75 mV
Lead Channel Setting Pacing Amplitude: 2.5 V
Lead Channel Setting Pacing Pulse Width: 0.8 ms
Lead Channel Setting Sensing Sensitivity: 0.3 mV

## 2016-11-05 ENCOUNTER — Ambulatory Visit (INDEPENDENT_AMBULATORY_CARE_PROVIDER_SITE_OTHER): Payer: Managed Care, Other (non HMO)

## 2016-11-05 ENCOUNTER — Other Ambulatory Visit: Payer: Self-pay

## 2016-11-05 ENCOUNTER — Ambulatory Visit (HOSPITAL_COMMUNITY): Payer: Managed Care, Other (non HMO) | Attending: Cardiovascular Disease

## 2016-11-05 DIAGNOSIS — R079 Chest pain, unspecified: Secondary | ICD-10-CM | POA: Diagnosis not present

## 2016-11-05 DIAGNOSIS — R0602 Shortness of breath: Secondary | ICD-10-CM

## 2016-11-05 LAB — EXERCISE TOLERANCE TEST
Estimated workload: 9.1 METS
Exercise duration (min): 7 min
Exercise duration (sec): 23 s
MPHR: 184 {beats}/min
Peak HR: 136 {beats}/min
Percent HR: 74 %
Percent of predicted max HR: 73 %
RPE: 19
Rest HR: 57 {beats}/min
Stage 1 DBP: 82 mmHg
Stage 1 Grade: 0 %
Stage 1 HR: 64 {beats}/min
Stage 1 SBP: 112 mmHg
Stage 1 Speed: 0 mph
Stage 2 Grade: 0 %
Stage 2 HR: 61 {beats}/min
Stage 2 Speed: 1 mph
Stage 3 Grade: 0 %
Stage 3 HR: 62 {beats}/min
Stage 3 Speed: 1 mph
Stage 4 DBP: 73 mmHg
Stage 4 Grade: 10 %
Stage 4 HR: 108 {beats}/min
Stage 4 SBP: 124 mmHg
Stage 4 Speed: 1.7 mph
Stage 5 DBP: 73 mmHg
Stage 5 Grade: 12 %
Stage 5 HR: 120 {beats}/min
Stage 5 SBP: 121 mmHg
Stage 5 Speed: 2.5 mph
Stage 6 Grade: 14 %
Stage 6 HR: 136 {beats}/min
Stage 6 Speed: 3.4 mph
Stage 7 DBP: 71 mmHg
Stage 7 Grade: 0 %
Stage 7 HR: 104 {beats}/min
Stage 7 SBP: 120 mmHg
Stage 7 Speed: 0 mph
Stage 8 DBP: 86 mmHg
Stage 8 Grade: 0 %
Stage 8 HR: 72 {beats}/min
Stage 8 SBP: 114 mmHg
Stage 8 Speed: 0 mph

## 2017-01-07 ENCOUNTER — Ambulatory Visit (INDEPENDENT_AMBULATORY_CARE_PROVIDER_SITE_OTHER): Payer: Managed Care, Other (non HMO) | Admitting: *Deleted

## 2017-01-07 DIAGNOSIS — I4581 Long QT syndrome: Secondary | ICD-10-CM

## 2017-01-07 NOTE — Progress Notes (Signed)
Remote ICD transmission.   

## 2017-01-09 ENCOUNTER — Encounter: Payer: Self-pay | Admitting: Cardiology

## 2017-01-09 LAB — CUP PACEART REMOTE DEVICE CHECK
Battery Remaining Longevity: 136 mo
Battery Voltage: 3.13 V
Brady Statistic RV Percent Paced: 0.01 %
Date Time Interrogation Session: 20180207062405
HighPow Impedance: 85 Ohm
Implantable Lead Implant Date: 20071112
Implantable Lead Location: 753860
Implantable Lead Model: 7002
Implantable Pulse Generator Implant Date: 20170802
Lead Channel Impedance Value: 437 Ohm
Lead Channel Impedance Value: 551 Ohm
Lead Channel Pacing Threshold Amplitude: 1.75 V
Lead Channel Pacing Threshold Pulse Width: 0.4 ms
Lead Channel Sensing Intrinsic Amplitude: 6.625 mV
Lead Channel Sensing Intrinsic Amplitude: 6.625 mV
Lead Channel Setting Pacing Amplitude: 2.5 V
Lead Channel Setting Pacing Pulse Width: 0.8 ms
Lead Channel Setting Sensing Sensitivity: 0.3 mV

## 2017-04-08 ENCOUNTER — Ambulatory Visit (INDEPENDENT_AMBULATORY_CARE_PROVIDER_SITE_OTHER): Payer: Managed Care, Other (non HMO) | Admitting: *Deleted

## 2017-04-08 DIAGNOSIS — I4581 Long QT syndrome: Secondary | ICD-10-CM

## 2017-04-08 NOTE — Progress Notes (Signed)
Remote ICD transmission.   

## 2017-04-09 LAB — CUP PACEART REMOTE DEVICE CHECK
Battery Remaining Longevity: 135 mo
Battery Voltage: 3.1 V
Brady Statistic RV Percent Paced: 0.01 %
Date Time Interrogation Session: 20180509062824
HighPow Impedance: 98 Ohm
Implantable Lead Implant Date: 20071112
Implantable Lead Location: 753860
Implantable Lead Model: 7002
Implantable Pulse Generator Implant Date: 20170802
Lead Channel Impedance Value: 437 Ohm
Lead Channel Impedance Value: 551 Ohm
Lead Channel Pacing Threshold Amplitude: 2.125 V
Lead Channel Pacing Threshold Pulse Width: 0.4 ms
Lead Channel Sensing Intrinsic Amplitude: 7.625 mV
Lead Channel Sensing Intrinsic Amplitude: 7.625 mV
Lead Channel Setting Pacing Amplitude: 2.5 V
Lead Channel Setting Pacing Pulse Width: 0.8 ms
Lead Channel Setting Sensing Sensitivity: 0.3 mV

## 2017-04-10 ENCOUNTER — Encounter: Payer: Self-pay | Admitting: Cardiology

## 2017-07-08 ENCOUNTER — Ambulatory Visit (INDEPENDENT_AMBULATORY_CARE_PROVIDER_SITE_OTHER): Payer: Managed Care, Other (non HMO) | Admitting: *Deleted

## 2017-07-08 DIAGNOSIS — I4581 Long QT syndrome: Secondary | ICD-10-CM | POA: Diagnosis not present

## 2017-07-08 NOTE — Progress Notes (Signed)
Remote ICD transmission.   

## 2017-07-09 ENCOUNTER — Encounter: Payer: Self-pay | Admitting: Cardiology

## 2017-07-24 LAB — CUP PACEART REMOTE DEVICE CHECK
Battery Remaining Longevity: 133 mo
Battery Voltage: 3.07 V
Brady Statistic RV Percent Paced: 0.01 %
Date Time Interrogation Session: 20180808062602
HighPow Impedance: 93 Ohm
Implantable Lead Implant Date: 20071112
Implantable Lead Location: 753860
Implantable Lead Model: 7002
Implantable Pulse Generator Implant Date: 20170802
Lead Channel Impedance Value: 456 Ohm
Lead Channel Impedance Value: 608 Ohm
Lead Channel Pacing Threshold Amplitude: 1.625 V
Lead Channel Pacing Threshold Pulse Width: 0.4 ms
Lead Channel Sensing Intrinsic Amplitude: 7.125 mV
Lead Channel Sensing Intrinsic Amplitude: 7.125 mV
Lead Channel Setting Pacing Amplitude: 2.5 V
Lead Channel Setting Pacing Pulse Width: 0.8 ms
Lead Channel Setting Sensing Sensitivity: 0.3 mV

## 2017-10-07 ENCOUNTER — Ambulatory Visit (INDEPENDENT_AMBULATORY_CARE_PROVIDER_SITE_OTHER): Payer: Managed Care, Other (non HMO) | Admitting: *Deleted

## 2017-10-07 DIAGNOSIS — I4581 Long QT syndrome: Secondary | ICD-10-CM

## 2017-10-07 NOTE — Progress Notes (Signed)
Remote ICD transmission.   

## 2017-10-08 ENCOUNTER — Encounter: Payer: Self-pay | Admitting: Cardiology

## 2017-10-13 LAB — CUP PACEART REMOTE DEVICE CHECK
Battery Remaining Longevity: 132 mo
Battery Voltage: 3.05 V
Brady Statistic RV Percent Paced: 0.01 %
Date Time Interrogation Session: 20181107062505
HighPow Impedance: 86 Ohm
Implantable Lead Implant Date: 20071112
Implantable Lead Location: 753860
Implantable Lead Model: 7002
Implantable Pulse Generator Implant Date: 20170802
Lead Channel Impedance Value: 437 Ohm
Lead Channel Impedance Value: 513 Ohm
Lead Channel Pacing Threshold Amplitude: 1.375 V
Lead Channel Pacing Threshold Pulse Width: 0.4 ms
Lead Channel Sensing Intrinsic Amplitude: 6.5 mV
Lead Channel Sensing Intrinsic Amplitude: 6.5 mV
Lead Channel Setting Pacing Amplitude: 2.5 V
Lead Channel Setting Pacing Pulse Width: 0.8 ms
Lead Channel Setting Sensing Sensitivity: 0.3 mV

## 2018-01-06 ENCOUNTER — Ambulatory Visit (INDEPENDENT_AMBULATORY_CARE_PROVIDER_SITE_OTHER): Payer: Managed Care, Other (non HMO) | Admitting: *Deleted

## 2018-01-06 DIAGNOSIS — I4581 Long QT syndrome: Secondary | ICD-10-CM

## 2018-01-06 NOTE — Progress Notes (Signed)
Remote ICD transmission.   

## 2018-01-07 ENCOUNTER — Encounter: Payer: Self-pay | Admitting: Cardiology

## 2018-01-28 LAB — CUP PACEART REMOTE DEVICE CHECK
Battery Remaining Longevity: 130 mo
Battery Voltage: 3.04 V
Brady Statistic RV Percent Paced: 0.01 %
Date Time Interrogation Session: 20190206051704
HighPow Impedance: 87 Ohm
Implantable Lead Implant Date: 20071112
Implantable Lead Location: 753860
Implantable Lead Model: 7002
Implantable Pulse Generator Implant Date: 20170802
Lead Channel Impedance Value: 437 Ohm
Lead Channel Impedance Value: 570 Ohm
Lead Channel Pacing Threshold Amplitude: 1.625 V
Lead Channel Pacing Threshold Pulse Width: 0.4 ms
Lead Channel Sensing Intrinsic Amplitude: 7 mV
Lead Channel Sensing Intrinsic Amplitude: 7 mV
Lead Channel Setting Pacing Amplitude: 2.5 V
Lead Channel Setting Pacing Pulse Width: 0.8 ms
Lead Channel Setting Sensing Sensitivity: 0.3 mV

## 2018-04-07 ENCOUNTER — Ambulatory Visit (INDEPENDENT_AMBULATORY_CARE_PROVIDER_SITE_OTHER): Payer: Managed Care, Other (non HMO) | Admitting: *Deleted

## 2018-04-07 DIAGNOSIS — I4581 Long QT syndrome: Secondary | ICD-10-CM | POA: Diagnosis not present

## 2018-04-07 NOTE — Progress Notes (Signed)
Remote ICD transmission.   

## 2018-04-08 ENCOUNTER — Encounter: Payer: Self-pay | Admitting: Cardiology

## 2018-04-28 LAB — CUP PACEART REMOTE DEVICE CHECK
Battery Remaining Longevity: 128 mo
Battery Voltage: 3.03 V
Brady Statistic RV Percent Paced: 0.01 %
Date Time Interrogation Session: 20190508052406
HighPow Impedance: 84 Ohm
Implantable Lead Implant Date: 20071112
Implantable Lead Location: 753860
Implantable Lead Model: 7002
Implantable Pulse Generator Implant Date: 20170802
Lead Channel Impedance Value: 399 Ohm
Lead Channel Impedance Value: 513 Ohm
Lead Channel Pacing Threshold Amplitude: 1.25 V
Lead Channel Pacing Threshold Pulse Width: 0.4 ms
Lead Channel Sensing Intrinsic Amplitude: 7.375 mV
Lead Channel Sensing Intrinsic Amplitude: 7.375 mV
Lead Channel Setting Pacing Amplitude: 2.5 V
Lead Channel Setting Pacing Pulse Width: 0.8 ms
Lead Channel Setting Sensing Sensitivity: 0.3 mV

## 2018-07-07 ENCOUNTER — Ambulatory Visit (INDEPENDENT_AMBULATORY_CARE_PROVIDER_SITE_OTHER): Payer: Managed Care, Other (non HMO) | Admitting: *Deleted

## 2018-07-07 DIAGNOSIS — I4581 Long QT syndrome: Secondary | ICD-10-CM | POA: Diagnosis not present

## 2018-07-08 NOTE — Progress Notes (Signed)
Remote ICD transmission.   

## 2018-07-27 LAB — CUP PACEART REMOTE DEVICE CHECK
Battery Remaining Longevity: 126 mo
Battery Voltage: 3.03 V
Brady Statistic RV Percent Paced: 0.01 %
Date Time Interrogation Session: 20190807092604
HighPow Impedance: 94 Ohm
Implantable Lead Implant Date: 20071112
Implantable Lead Location: 753860
Implantable Lead Model: 7002
Implantable Pulse Generator Implant Date: 20170802
Lead Channel Impedance Value: 456 Ohm
Lead Channel Impedance Value: 570 Ohm
Lead Channel Pacing Threshold Amplitude: 1.375 V
Lead Channel Pacing Threshold Pulse Width: 0.4 ms
Lead Channel Sensing Intrinsic Amplitude: 8.625 mV
Lead Channel Sensing Intrinsic Amplitude: 8.625 mV
Lead Channel Setting Pacing Amplitude: 2.5 V
Lead Channel Setting Pacing Pulse Width: 0.8 ms
Lead Channel Setting Sensing Sensitivity: 0.3 mV

## 2018-10-06 ENCOUNTER — Ambulatory Visit (INDEPENDENT_AMBULATORY_CARE_PROVIDER_SITE_OTHER): Payer: Managed Care, Other (non HMO) | Admitting: *Deleted

## 2018-10-06 DIAGNOSIS — I4581 Long QT syndrome: Secondary | ICD-10-CM | POA: Diagnosis not present

## 2018-10-06 DIAGNOSIS — R55 Syncope and collapse: Secondary | ICD-10-CM

## 2018-10-06 NOTE — Progress Notes (Signed)
Remote ICD transmission.   

## 2018-10-10 ENCOUNTER — Encounter: Payer: Self-pay | Admitting: Cardiology

## 2018-12-10 LAB — CUP PACEART REMOTE DEVICE CHECK
Battery Remaining Longevity: 124 mo
Battery Voltage: 3.03 V
Brady Statistic RV Percent Paced: 0.01 %
Date Time Interrogation Session: 20191106093724
HighPow Impedance: 93 Ohm
Implantable Lead Implant Date: 20071112
Implantable Lead Location: 753860
Implantable Lead Model: 7002
Implantable Pulse Generator Implant Date: 20170802
Lead Channel Impedance Value: 437 Ohm
Lead Channel Impedance Value: 551 Ohm
Lead Channel Pacing Threshold Amplitude: 1.75 V
Lead Channel Pacing Threshold Pulse Width: 0.4 ms
Lead Channel Sensing Intrinsic Amplitude: 8.125 mV
Lead Channel Sensing Intrinsic Amplitude: 8.125 mV
Lead Channel Setting Pacing Amplitude: 2.5 V
Lead Channel Setting Pacing Pulse Width: 0.8 ms
Lead Channel Setting Sensing Sensitivity: 0.3 mV

## 2019-01-05 ENCOUNTER — Ambulatory Visit (INDEPENDENT_AMBULATORY_CARE_PROVIDER_SITE_OTHER): Payer: Managed Care, Other (non HMO)

## 2019-01-05 DIAGNOSIS — I4581 Long QT syndrome: Secondary | ICD-10-CM

## 2019-01-05 DIAGNOSIS — R55 Syncope and collapse: Secondary | ICD-10-CM

## 2019-01-07 LAB — CUP PACEART REMOTE DEVICE CHECK
Battery Remaining Longevity: 122 mo
Battery Voltage: 3.02 V
Brady Statistic RV Percent Paced: 0.01 %
Date Time Interrogation Session: 20200205083325
HighPow Impedance: 90 Ohm
Implantable Lead Implant Date: 20071112
Implantable Lead Location: 753860
Implantable Lead Model: 7002
Implantable Pulse Generator Implant Date: 20170802
Lead Channel Impedance Value: 437 Ohm
Lead Channel Impedance Value: 551 Ohm
Lead Channel Pacing Threshold Amplitude: 1.5 V
Lead Channel Pacing Threshold Pulse Width: 0.4 ms
Lead Channel Sensing Intrinsic Amplitude: 7.875 mV
Lead Channel Sensing Intrinsic Amplitude: 7.875 mV
Lead Channel Setting Pacing Amplitude: 2.5 V
Lead Channel Setting Pacing Pulse Width: 0.8 ms
Lead Channel Setting Sensing Sensitivity: 0.3 mV

## 2019-01-14 NOTE — Progress Notes (Signed)
Remote ICD transmission.   

## 2019-04-13 ENCOUNTER — Ambulatory Visit (INDEPENDENT_AMBULATORY_CARE_PROVIDER_SITE_OTHER): Payer: Managed Care, Other (non HMO) | Admitting: *Deleted

## 2019-04-13 ENCOUNTER — Other Ambulatory Visit: Payer: Self-pay

## 2019-04-13 DIAGNOSIS — I4581 Long QT syndrome: Secondary | ICD-10-CM

## 2019-04-13 DIAGNOSIS — R55 Syncope and collapse: Secondary | ICD-10-CM

## 2019-04-13 LAB — CUP PACEART REMOTE DEVICE CHECK
Battery Remaining Longevity: 120 mo
Battery Voltage: 3.01 V
Brady Statistic RV Percent Paced: 0.01 %
Date Time Interrogation Session: 20200513083723
HighPow Impedance: 101 Ohm
Implantable Lead Implant Date: 20071112
Implantable Lead Location: 753860
Implantable Lead Model: 7002
Implantable Pulse Generator Implant Date: 20170802
Lead Channel Impedance Value: 380 Ohm
Lead Channel Impedance Value: 494 Ohm
Lead Channel Pacing Threshold Amplitude: 1.75 V
Lead Channel Pacing Threshold Pulse Width: 0.4 ms
Lead Channel Sensing Intrinsic Amplitude: 7.625 mV
Lead Channel Sensing Intrinsic Amplitude: 7.625 mV
Lead Channel Setting Pacing Amplitude: 2.5 V
Lead Channel Setting Pacing Pulse Width: 0.8 ms
Lead Channel Setting Sensing Sensitivity: 0.3 mV

## 2019-05-03 NOTE — Progress Notes (Signed)
Remote ICD transmission.   

## 2019-07-14 ENCOUNTER — Ambulatory Visit (INDEPENDENT_AMBULATORY_CARE_PROVIDER_SITE_OTHER): Payer: Managed Care, Other (non HMO) | Admitting: *Deleted

## 2019-07-14 DIAGNOSIS — I4581 Long QT syndrome: Secondary | ICD-10-CM | POA: Diagnosis not present

## 2019-07-14 LAB — CUP PACEART REMOTE DEVICE CHECK
Battery Remaining Longevity: 118 mo
Battery Voltage: 3.01 V
Brady Statistic RV Percent Paced: 0.01 %
Date Time Interrogation Session: 20200813052204
HighPow Impedance: 103 Ohm
Implantable Lead Implant Date: 20071112
Implantable Lead Location: 753860
Implantable Lead Model: 7002
Implantable Pulse Generator Implant Date: 20170802
Lead Channel Impedance Value: 437 Ohm
Lead Channel Impedance Value: 570 Ohm
Lead Channel Pacing Threshold Amplitude: 1.625 V
Lead Channel Pacing Threshold Pulse Width: 0.4 ms
Lead Channel Sensing Intrinsic Amplitude: 9.5 mV
Lead Channel Setting Pacing Amplitude: 2.5 V
Lead Channel Setting Pacing Pulse Width: 0.8 ms
Lead Channel Setting Sensing Sensitivity: 0.3 mV

## 2019-07-25 ENCOUNTER — Encounter: Payer: Self-pay | Admitting: Cardiology

## 2019-07-25 NOTE — Progress Notes (Signed)
Remote ICD transmission.   

## 2019-10-13 ENCOUNTER — Encounter: Payer: Managed Care, Other (non HMO) | Admitting: *Deleted

## 2019-10-17 ENCOUNTER — Telehealth: Payer: Self-pay

## 2019-10-17 NOTE — Telephone Encounter (Signed)
Left message for patient to remind of missed remote transmission.  

## 2019-12-09 ENCOUNTER — Ambulatory Visit (INDEPENDENT_AMBULATORY_CARE_PROVIDER_SITE_OTHER): Payer: Managed Care, Other (non HMO) | Admitting: *Deleted

## 2019-12-09 DIAGNOSIS — I4581 Long QT syndrome: Secondary | ICD-10-CM | POA: Diagnosis not present

## 2019-12-09 LAB — CUP PACEART REMOTE DEVICE CHECK
Battery Remaining Longevity: 113 mo
Battery Voltage: 3.01 V
Brady Statistic RV Percent Paced: 0.01 %
Date Time Interrogation Session: 20210107184336
HighPow Impedance: 99 Ohm
Implantable Lead Implant Date: 20071112
Implantable Lead Location: 753860
Implantable Lead Model: 7002
Implantable Pulse Generator Implant Date: 20170802
Lead Channel Impedance Value: 437 Ohm
Lead Channel Impedance Value: 551 Ohm
Lead Channel Pacing Threshold Amplitude: 1.625 V
Lead Channel Pacing Threshold Pulse Width: 0.4 ms
Lead Channel Sensing Intrinsic Amplitude: 7.75 mV
Lead Channel Sensing Intrinsic Amplitude: 7.75 mV
Lead Channel Setting Pacing Amplitude: 2.5 V
Lead Channel Setting Pacing Pulse Width: 0.8 ms
Lead Channel Setting Sensing Sensitivity: 0.3 mV

## 2020-03-09 ENCOUNTER — Ambulatory Visit (INDEPENDENT_AMBULATORY_CARE_PROVIDER_SITE_OTHER): Payer: Managed Care, Other (non HMO) | Admitting: *Deleted

## 2020-03-09 DIAGNOSIS — I4581 Long QT syndrome: Secondary | ICD-10-CM

## 2020-03-09 LAB — CUP PACEART REMOTE DEVICE CHECK
Battery Remaining Longevity: 109 mo
Battery Voltage: 3.01 V
Brady Statistic RV Percent Paced: 0.01 %
Date Time Interrogation Session: 20210409001803
HighPow Impedance: 108 Ohm
Implantable Lead Implant Date: 20071112
Implantable Lead Location: 753860
Implantable Lead Model: 7002
Implantable Pulse Generator Implant Date: 20170802
Lead Channel Impedance Value: 437 Ohm
Lead Channel Impedance Value: 570 Ohm
Lead Channel Pacing Threshold Amplitude: 1.5 V
Lead Channel Pacing Threshold Pulse Width: 0.4 ms
Lead Channel Sensing Intrinsic Amplitude: 7.125 mV
Lead Channel Sensing Intrinsic Amplitude: 7.125 mV
Lead Channel Setting Pacing Amplitude: 2.5 V
Lead Channel Setting Pacing Pulse Width: 0.8 ms
Lead Channel Setting Sensing Sensitivity: 0.3 mV

## 2020-03-09 NOTE — Progress Notes (Signed)
ICD Remote  

## 2020-04-18 ENCOUNTER — Telehealth: Payer: Self-pay | Admitting: Internal Medicine

## 2020-04-18 NOTE — Telephone Encounter (Signed)
New message  1. What dental office are you calling from? [facility]  2. What is your office phone number? [phone]  3. What is your fax number?[phone]  4. What type of procedure is the patient having performed? 3 tooth extractions  5. What date is procedure scheduled or is the patient there now?Patient is in the chair now (if the patient is at the dentist's office question goes to their cardiologist if he/she is in the office.  If not, question should go to the DOD).   6. What is your question (ex. Antibiotics prior to procedure, holding medication-we need to know how long dentist wants pt to hold med)? Dentist office needs to know if a medication needs to be withheld.

## 2020-04-18 NOTE — Telephone Encounter (Signed)
Spoke with [facility]. Adv that patient was last seen in cardiology in November 2017. They asked if clearance can be given for extraction of several teeth or if she needs to be off her heart medication first. I adv that we have not been prescribing her medications and not sure her list is current or if there are any new or current cardiac concerns that her cardiologist is unaware of since she has not been seen.  Adv that they contact the provider that has been prescribing nadolol and check with them.

## 2020-06-08 ENCOUNTER — Ambulatory Visit (INDEPENDENT_AMBULATORY_CARE_PROVIDER_SITE_OTHER): Payer: Managed Care, Other (non HMO) | Admitting: *Deleted

## 2020-06-08 DIAGNOSIS — I4581 Long QT syndrome: Secondary | ICD-10-CM

## 2020-06-09 LAB — CUP PACEART REMOTE DEVICE CHECK
Battery Remaining Longevity: 105 mo
Battery Voltage: 3 V
Brady Statistic RV Percent Paced: 0.01 %
Date Time Interrogation Session: 20210709022604
HighPow Impedance: 89 Ohm
Implantable Lead Implant Date: 20071112
Implantable Lead Location: 753860
Implantable Lead Model: 7002
Implantable Pulse Generator Implant Date: 20170802
Lead Channel Impedance Value: 380 Ohm
Lead Channel Impedance Value: 494 Ohm
Lead Channel Pacing Threshold Amplitude: 1.5 V
Lead Channel Pacing Threshold Pulse Width: 0.4 ms
Lead Channel Sensing Intrinsic Amplitude: 7.25 mV
Lead Channel Sensing Intrinsic Amplitude: 7.25 mV
Lead Channel Setting Pacing Amplitude: 2.5 V
Lead Channel Setting Pacing Pulse Width: 0.8 ms
Lead Channel Setting Sensing Sensitivity: 0.3 mV

## 2020-06-11 NOTE — Progress Notes (Signed)
Remote ICD transmission.   

## 2020-09-07 ENCOUNTER — Ambulatory Visit (INDEPENDENT_AMBULATORY_CARE_PROVIDER_SITE_OTHER): Payer: Managed Care, Other (non HMO)

## 2020-09-07 DIAGNOSIS — I4581 Long QT syndrome: Secondary | ICD-10-CM

## 2020-09-07 LAB — CUP PACEART REMOTE DEVICE CHECK
Battery Remaining Longevity: 99 mo
Battery Voltage: 3 V
Brady Statistic RV Percent Paced: 0.01 %
Date Time Interrogation Session: 20211008002204
HighPow Impedance: 109 Ohm
Implantable Lead Implant Date: 20071112
Implantable Lead Location: 753860
Implantable Lead Model: 7002
Implantable Pulse Generator Implant Date: 20170802
Lead Channel Impedance Value: 380 Ohm
Lead Channel Impedance Value: 494 Ohm
Lead Channel Pacing Threshold Amplitude: 1.5 V
Lead Channel Pacing Threshold Pulse Width: 0.4 ms
Lead Channel Sensing Intrinsic Amplitude: 7.375 mV
Lead Channel Sensing Intrinsic Amplitude: 7.375 mV
Lead Channel Setting Pacing Amplitude: 2.5 V
Lead Channel Setting Pacing Pulse Width: 0.8 ms
Lead Channel Setting Sensing Sensitivity: 0.3 mV

## 2020-09-11 NOTE — Progress Notes (Signed)
Remote ICD transmission.   

## 2020-12-07 ENCOUNTER — Ambulatory Visit (INDEPENDENT_AMBULATORY_CARE_PROVIDER_SITE_OTHER): Payer: Managed Care, Other (non HMO)

## 2020-12-07 DIAGNOSIS — R55 Syncope and collapse: Secondary | ICD-10-CM | POA: Diagnosis not present

## 2020-12-08 LAB — CUP PACEART REMOTE DEVICE CHECK
Battery Remaining Longevity: 98 mo
Battery Voltage: 3 V
Brady Statistic RV Percent Paced: 0.01 %
Date Time Interrogation Session: 20220107044224
HighPow Impedance: 106 Ohm
Implantable Lead Implant Date: 20071112
Implantable Lead Location: 753860
Implantable Lead Model: 7002
Implantable Pulse Generator Implant Date: 20170802
Lead Channel Impedance Value: 437 Ohm
Lead Channel Impedance Value: 551 Ohm
Lead Channel Pacing Threshold Amplitude: 1.75 V
Lead Channel Pacing Threshold Pulse Width: 0.4 ms
Lead Channel Sensing Intrinsic Amplitude: 6.375 mV
Lead Channel Sensing Intrinsic Amplitude: 6.375 mV
Lead Channel Setting Pacing Amplitude: 2.5 V
Lead Channel Setting Pacing Pulse Width: 0.8 ms
Lead Channel Setting Sensing Sensitivity: 0.3 mV

## 2020-12-21 NOTE — Progress Notes (Signed)
Remote ICD transmission.   

## 2021-02-18 ENCOUNTER — Emergency Department (HOSPITAL_COMMUNITY)
Admission: EM | Admit: 2021-02-18 | Discharge: 2021-02-18 | Disposition: A | Discharge status: Home / Self Care | Payer: Self-pay | Attending: Emergency Medicine | Admitting: Emergency Medicine

## 2021-02-18 ENCOUNTER — Emergency Department (HOSPITAL_COMMUNITY): Payer: Self-pay

## 2021-02-18 ENCOUNTER — Encounter (HOSPITAL_COMMUNITY): Payer: Self-pay | Admitting: Emergency Medicine

## 2021-02-18 DIAGNOSIS — Y92524 Gas station as the place of occurrence of the external cause: Secondary | ICD-10-CM | POA: Insufficient documentation

## 2021-02-18 DIAGNOSIS — S300XXA Contusion of lower back and pelvis, initial encounter: Secondary | ICD-10-CM | POA: Insufficient documentation

## 2021-02-18 DIAGNOSIS — S322XXA Fracture of coccyx, initial encounter for closed fracture: Secondary | ICD-10-CM | POA: Insufficient documentation

## 2021-02-18 LAB — PREGNANCY, URINE: Preg Test, Ur: NEGATIVE

## 2021-02-18 MED ORDER — HYDROCODONE-ACETAMINOPHEN 5-325 MG PO TABS
1.0000 | ORAL_TABLET | Freq: Once | ORAL | Status: AC
  Administered 2021-02-18: 1 via ORAL
  Filled 2021-02-18: qty 1

## 2021-02-18 MED ORDER — HYDROCODONE-ACETAMINOPHEN 5-325 MG PO TABS: 1.0000 | ORAL_TABLET | ORAL | 0 refills | Status: DC | PRN
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
## 2021-03-15 ENCOUNTER — Ambulatory Visit (INDEPENDENT_AMBULATORY_CARE_PROVIDER_SITE_OTHER): Payer: Self-pay

## 2021-03-15 DIAGNOSIS — R55 Syncope and collapse: Secondary | ICD-10-CM

## 2021-03-19 LAB — CUP PACEART REMOTE DEVICE CHECK
Battery Remaining Longevity: 93 mo
Battery Voltage: 2.99 V
Brady Statistic RV Percent Paced: 0.01 %
Date Time Interrogation Session: 20220415060101
HighPow Impedance: 95 Ohm
Implantable Lead Implant Date: 20071112
Implantable Lead Location: 753860
Implantable Lead Model: 7002
Implantable Pulse Generator Implant Date: 20170802
Lead Channel Impedance Value: 399 Ohm
Lead Channel Impedance Value: 513 Ohm
Lead Channel Pacing Threshold Amplitude: 2.375 V
Lead Channel Pacing Threshold Pulse Width: 0.4 ms
Lead Channel Sensing Intrinsic Amplitude: 6.625 mV
Lead Channel Sensing Intrinsic Amplitude: 6.625 mV
Lead Channel Setting Pacing Amplitude: 2.5 V
Lead Channel Setting Pacing Pulse Width: 0.8 ms
Lead Channel Setting Sensing Sensitivity: 0.3 mV

## 2021-04-01 NOTE — Progress Notes (Signed)
Remote ICD transmission.   

## 2021-04-09 ENCOUNTER — Telehealth: Payer: Self-pay | Admitting: Internal Medicine

## 2021-04-09 NOTE — Telephone Encounter (Signed)
New Message:      Pt wants to cx all her device checks, she no longer have any insurance.

## 2021-04-09 NOTE — Telephone Encounter (Signed)
Attempted to contact patient. Left voicemail with Device Clinic number [phone].

## 2021-04-19 NOTE — Telephone Encounter (Signed)
Successful telephone encounter to patient to discuss her wishes to cancel all remote transmissions secondary to not having insurance. Per patient, her divorce was finalized on 03/22/21 and her insurance was cancelled. She has applied for assistance including orange card. She is agreeable to device clinic appointment in 3 months to have ICD interrogated incase she is approved. She is instructed to unplug her remote transmitter. All remote transmission appointments in epic cancelled. Will send to scheduling for 3 month in-clinic check appointment.

## 2021-05-20 ENCOUNTER — Encounter: Payer: Self-pay | Admitting: Internal Medicine

## 2021-09-10 IMAGING — CR DG SACRUM/COCCYX 2+V
3 series · 3 of 3 positions shown · non-contrast
Comparison: None.

CT abdomen pelvis dated February 04, 2010.

CLINICAL DATA: Fell on her tailbone two days ago.

EXAM:
SACRUM AND COCCYX - 2+ VIEW; PELVIS - 1-2 VIEW

[coccyx ap]
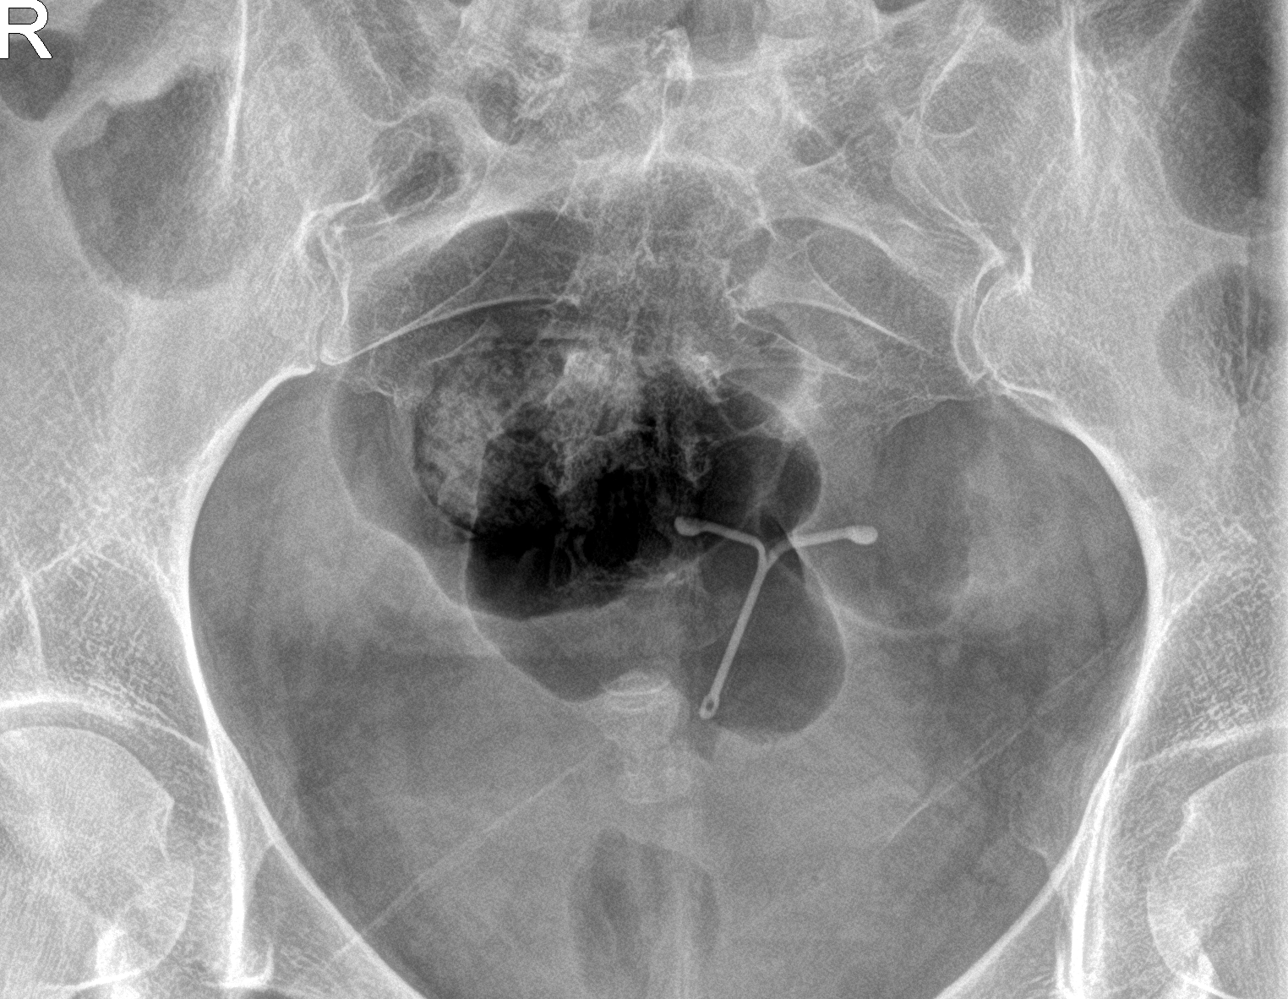

[sacrum ap]
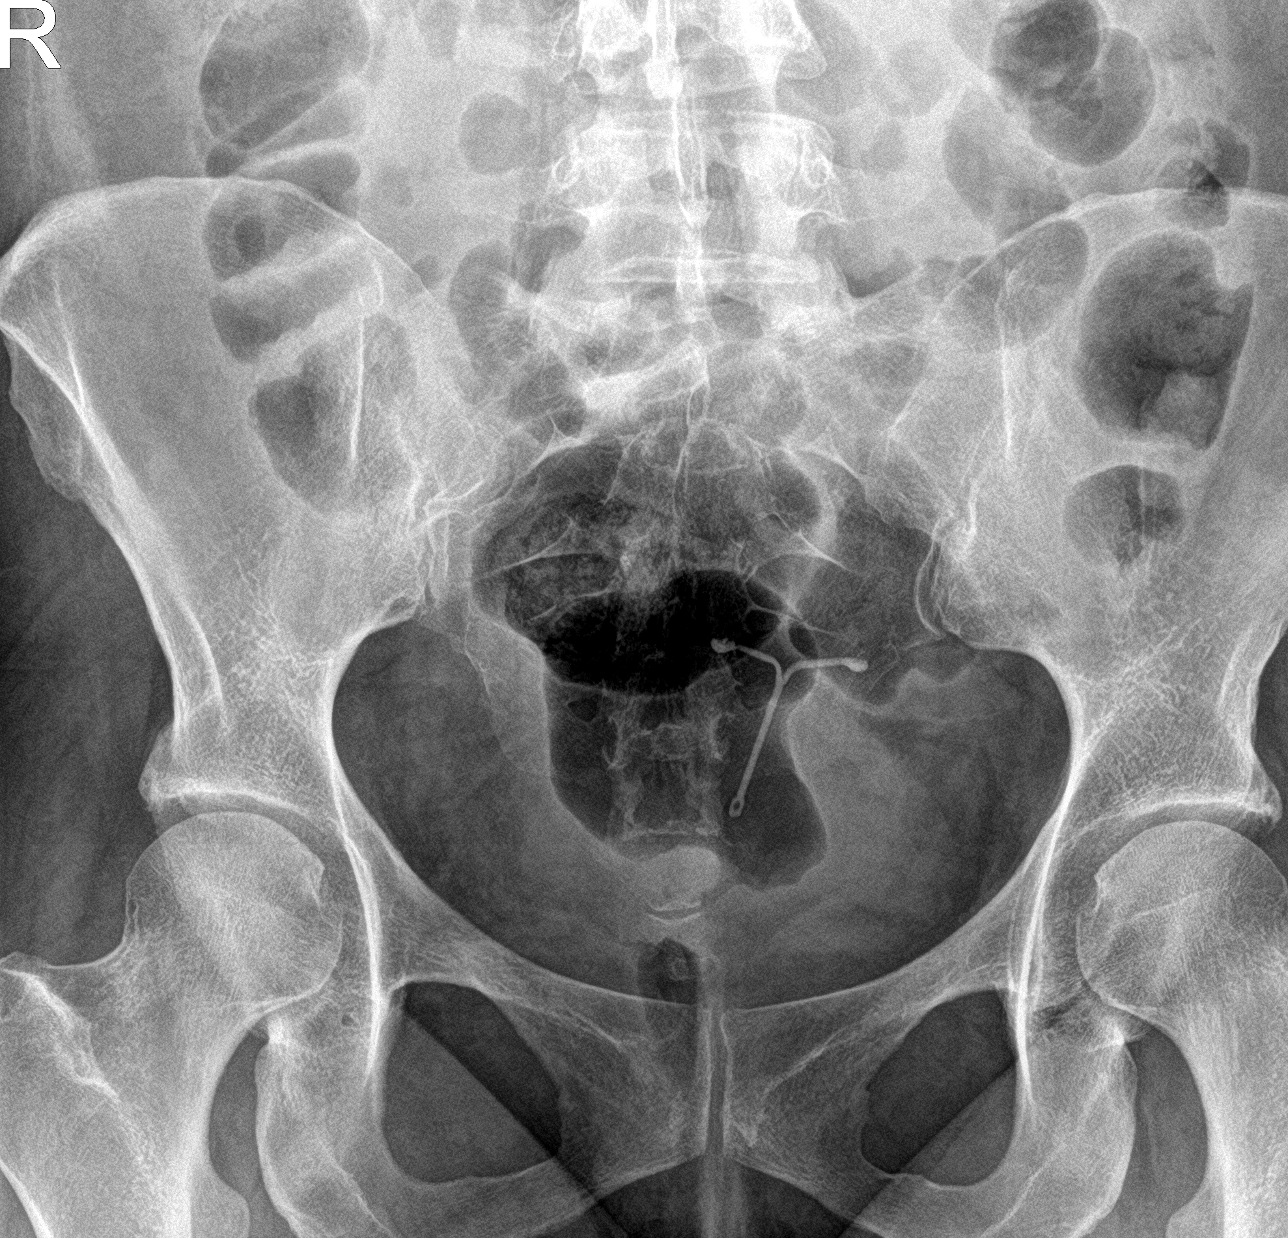

[sacrum lat]
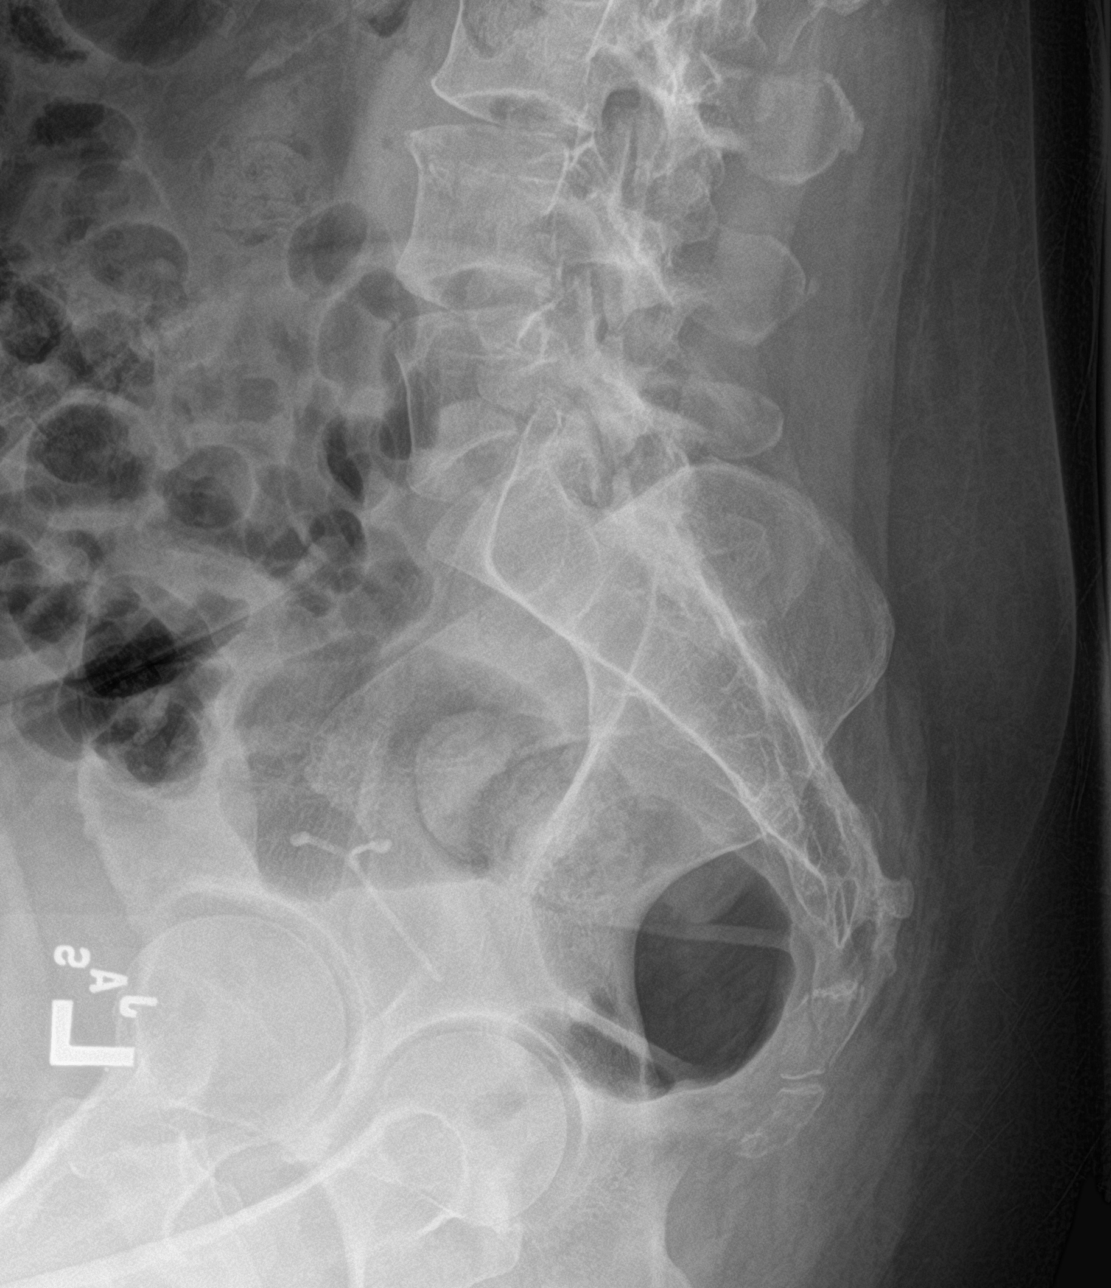

[3 of 3 positions shown; findings below may reference images not displayed]

FINDINGS: Acute nondisplaced fracture of the C1 segment. No diastasis. Hip
joint spaces are preserved. No pelvic bone lesions are seen. IUD in
the pelvis.
IMPRESSION: 1. Acute nondisplaced fracture of the coccyx.

## 2022-05-12 ENCOUNTER — Other Ambulatory Visit: Payer: Self-pay | Admitting: *Deleted

## 2022-05-12 ENCOUNTER — Other Ambulatory Visit: Payer: Self-pay

## 2022-05-12 DIAGNOSIS — Z1231 Encounter for screening mammogram for malignant neoplasm of breast: Secondary | ICD-10-CM

## 2023-04-13 DIAGNOSIS — W19XXXA Unspecified fall, initial encounter: Secondary | ICD-10-CM | POA: Diagnosis not present

## 2023-04-13 DIAGNOSIS — M79672 Pain in left foot: Secondary | ICD-10-CM | POA: Diagnosis not present

## 2023-04-23 DIAGNOSIS — R519 Headache, unspecified: Secondary | ICD-10-CM | POA: Diagnosis not present

## 2023-05-12 DIAGNOSIS — Z0000 Encounter for general adult medical examination without abnormal findings: Secondary | ICD-10-CM | POA: Diagnosis not present

## 2023-06-11 ENCOUNTER — Telehealth: Payer: Self-pay | Admitting: Internal Medicine

## 2023-06-11 NOTE — Telephone Encounter (Signed)
Pt would like to re-establish care.  Pt states she would like to try to get in for a appointment within the next 2 weeks. Pt would like a form filled out for her food stamps while at this visit. Said her case worker would like it done in the next 2 weeks so that she would be able to obtain her August food stamps. Pt was offered appt on 07/13/23 at 11:15am prior to our conversion. Told patient I would put her down for this appointment and send a message to Dr.Klein and his nurse for them to evaluate for a sooner appointment. Told pt I could not promise a sooner visit and we spoke about contacting her PCP for this forms.  Will route to Dr.Klein for review.

## 2023-06-11 NOTE — Telephone Encounter (Signed)
Patient called and requested to speak with Dr. Graciela Husbands or nurse. Patient hasn't been seen since 2017. Offered patient 8/12 as NP at 11:15 but patient declined.

## 2023-06-17 ENCOUNTER — Telehealth (HOSPITAL_COMMUNITY): Payer: Self-pay | Admitting: Licensed Clinical Social Worker

## 2023-06-17 NOTE — Telephone Encounter (Signed)
CSW received referral to assist patient with issue related to her food stamps. CSW contacted patient multiple times and left message for return call. Lasandra Beech, LCSW, CCSW-MCS [phone]

## 2023-06-23 ENCOUNTER — Telehealth (HOSPITAL_COMMUNITY): Payer: Self-pay | Admitting: Licensed Clinical Social Worker

## 2023-06-23 NOTE — Telephone Encounter (Signed)
CSW attempted again to reach patient with no success. Call went straight to voicemail and message left. Lasandra Beech, LCSW, CCSW-MCS [phone]

## 2023-07-13 ENCOUNTER — Ambulatory Visit: Payer: Medicaid Other | Attending: Internal Medicine | Admitting: Internal Medicine

## 2023-07-13 ENCOUNTER — Encounter: Payer: Self-pay | Admitting: Internal Medicine

## 2023-07-13 VITALS — BP 116/74 | HR 76 | Ht 64.0 in | Wt 188.0 lb

## 2023-07-13 DIAGNOSIS — Z95810 Presence of automatic (implantable) cardiac defibrillator: Secondary | ICD-10-CM | POA: Diagnosis not present

## 2023-07-13 DIAGNOSIS — I4581 Long QT syndrome: Secondary | ICD-10-CM

## 2023-07-13 LAB — CUP PACEART INCLINIC DEVICE CHECK
Battery Remaining Longevity: 52 mo
Battery Voltage: 2.98 V
Brady Statistic RV Percent Paced: 0.01 %
Date Time Interrogation Session: 20240812125532
HighPow Impedance: 102 Ohm
Implantable Lead Connection Status: 753985
Implantable Lead Implant Date: 20071112
Implantable Lead Location: 753860
Implantable Lead Model: 7002
Implantable Pulse Generator Implant Date: 20170802
Lead Channel Impedance Value: 437 Ohm
Lead Channel Impedance Value: 570 Ohm
Lead Channel Pacing Threshold Amplitude: 1.75 V
Lead Channel Pacing Threshold Amplitude: 2.5 V
Lead Channel Pacing Threshold Pulse Width: 0.4 ms
Lead Channel Pacing Threshold Pulse Width: 1 ms
Lead Channel Sensing Intrinsic Amplitude: 8.5 mV
Lead Channel Sensing Intrinsic Amplitude: 9.125 mV
Lead Channel Setting Pacing Amplitude: 2.5 V
Lead Channel Setting Pacing Pulse Width: 1 ms
Lead Channel Setting Sensing Sensitivity: 0.3 mV
Zone Setting Status: 755011

## 2023-07-13 NOTE — Patient Instructions (Signed)
Medication Instructions:  Your physician recommends that you continue on your current medications as directed. Please refer to the Current Medication list given to you today.  *If you need a refill on your cardiac medications before your next appointment, please call your pharmacy*  Follow-Up: At [facility], you and your health needs are our priority.  As part of our continuing mission to provide you with exceptional heart care, we have created designated Provider Care Teams.  These Care Teams include your primary Cardiologist (physician) and Advanced Practice Providers (APPs -  Physician Assistants and Nurse Practitioners) who all work together to provide you with the care you need, when you need it.  Your next appointment:   1 year  Provider:   You may see Sherryl Manges, MD or one of the following Advanced Practice Providers on your designated Care Team:   Francis Dowse, [place] [address] [place], [place] Sherie Don, NP Canary Brim, NP     Recommended salt supplementation.  The goal is about 7-10 g of sodium, not sodium chloride, a day.  Salt supplements include oral ThermaTabs, buffered sodium preparation, SaltStick Vitassium,  Other options include NUUN.  This comes in pill form and is dissolved in liquids.  Other liquid preparations include liquid IV, Pedialyte advance care, TRI-oral Also discussed the role of compression wear including thigh sleeves and abdominal binders.  Calf compression is not specifically recommended.Marland Kitchen
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
## 2023-10-14 ENCOUNTER — Ambulatory Visit: Payer: Medicaid Other

## 2023-12-25 DIAGNOSIS — R0602 Shortness of breath: Secondary | ICD-10-CM | POA: Diagnosis not present

## 2023-12-25 DIAGNOSIS — E079 Disorder of thyroid, unspecified: Secondary | ICD-10-CM | POA: Diagnosis not present

## 2023-12-25 DIAGNOSIS — J9811 Atelectasis: Secondary | ICD-10-CM | POA: Diagnosis not present

## 2023-12-25 DIAGNOSIS — K219 Gastro-esophageal reflux disease without esophagitis: Secondary | ICD-10-CM | POA: Diagnosis not present

## 2023-12-25 DIAGNOSIS — Z8709 Personal history of other diseases of the respiratory system: Secondary | ICD-10-CM | POA: Diagnosis not present

## 2023-12-25 DIAGNOSIS — R0609 Other forms of dyspnea: Secondary | ICD-10-CM | POA: Diagnosis not present

## 2023-12-25 DIAGNOSIS — R519 Headache, unspecified: Secondary | ICD-10-CM | POA: Diagnosis not present

## 2023-12-25 DIAGNOSIS — R911 Solitary pulmonary nodule: Secondary | ICD-10-CM | POA: Diagnosis not present

## 2023-12-25 DIAGNOSIS — Z95810 Presence of automatic (implantable) cardiac defibrillator: Secondary | ICD-10-CM | POA: Diagnosis not present

## 2023-12-25 DIAGNOSIS — Z950 Presence of cardiac pacemaker: Secondary | ICD-10-CM | POA: Diagnosis not present

## 2023-12-25 DIAGNOSIS — J90 Pleural effusion, not elsewhere classified: Secondary | ICD-10-CM | POA: Diagnosis not present

## 2023-12-25 DIAGNOSIS — R9431 Abnormal electrocardiogram [ECG] [EKG]: Secondary | ICD-10-CM | POA: Diagnosis not present

## 2023-12-25 DIAGNOSIS — R0989 Other specified symptoms and signs involving the circulatory and respiratory systems: Secondary | ICD-10-CM | POA: Diagnosis not present

## 2023-12-25 DIAGNOSIS — R059 Cough, unspecified: Secondary | ICD-10-CM | POA: Diagnosis not present

## 2024-05-18 ENCOUNTER — Other Ambulatory Visit: Payer: Self-pay | Admitting: Internal Medicine

## 2024-05-18 DIAGNOSIS — Z1231 Encounter for screening mammogram for malignant neoplasm of breast: Secondary | ICD-10-CM

## 2024-06-13 ENCOUNTER — Telehealth: Payer: Self-pay | Admitting: Internal Medicine

## 2024-06-13 NOTE — Telephone Encounter (Signed)
 Patient called to follow-up on her request for medical records regarding her disability claim

## 2024-07-10 NOTE — Progress Notes (Deleted)
 Cardiology Office Note:  .   Date:  07/10/2024  ID:  [name], DOB [birth year], MRN [id number] PCP: Ransom Other, MD  [facility] Providers Cardiologist:  None Electrophysiologist:  Elspeth Sage, MD {  History of Present Illness: [name] is a 43 y.o. female w/PMHx of  Bipolar d/o, anxiety/depression, panic attacks hypothyroidism Orthostatic intolerance/shower/heat intolerance  Dr. Sage describes concerns for Long QT syndrome, syncope > ICD  She saw Dr. Sage last Aug 2024, god days and bad days Episodes of lightheadedness are characterized by pallor and diaphoresis.   She also described falls where she drifts to one side and then falls down. Reported her diet as salt replete Nadolol continued No syncope/arrhythmias Symptoms suggest autonomic dysfunction orthostatic vital not revealing Requested social service support forms be filled out> deferred to her PMD, as well as further w/u into her falls.   Today's visit is scheduled as a 59mo visit ROS:   *** had a long hiatus without remotes *** symptoms *** syncope *** new EP MD >> noone reading/check paceart *** meds, QT   Device information MDT single chamber ICD implanted 10/12/2006, gen change 07/02/2016   Studies Reviewed: SABRA    EKG done today and reviewed by myself:  ***  DEVICE interrogation done today and reviewed by myself *** Battery and lead measurements are good ***   11/05/2016: TTE Study Conclusions  - Left ventricle: The cavity size was normal. Wall thickness was    normal. Systolic function was normal. The estimated ejection    fraction was in the range of 55% to 60%. Wall motion was normal;    there were no regional wall motion abnormalities. Left    ventricular diastolic function parameters were normal.  - Aortic valve: There was no stenosis.  - Mitral valve: There was no regurgitation.  - Right ventricle: The cavity size was normal. Pacer wire or    catheter noted  in right ventricle. Systolic function was normal.  - Pulmonary arteries: No complete TR doppler jet so unable to    estimate PA systolic pressure.  - Inferior vena cava: The vessel was normal in size. The    respirophasic diameter changes were in the normal range (= 50%),    consistent with normal central venous pressure.    11/04/2016: ETT The patient has TWI in lead I at baseline. She walked for 7:23 of a standard Bruce protocol GXT. Peak HR of 136 which is 74% predicted maximal HR . There were no ST or T wave changes to suggest ischemia . Nondiagnostic GXT due to inadequate HR response.     Risk Assessment/Calculations:    Physical Exam:   VS:  There were no vitals taken for this visit.   Wt Readings from Last 3 Encounters:  07/13/23 188 lb (85.3 kg)  10/08/16 140 lb 3.2 oz (63.6 kg)  07/02/16 151 lb (68.5 kg)    GEN: Well nourished, well developed in no acute distress NECK: No JVD; No carotid bruits CARDIAC: ***RRR, no murmurs, rubs, gallops RESPIRATORY:  *** CTA b/l without rales, wheezing or rhonchi  ABDOMEN: Soft, non-tender, non-distended EXTREMITIES: *** No edema; No deformity   ICD site: *** is stable, no thinning, fluctuation, tethering  ASSESSMENT AND PLAN: .    Long QT syndrome (?) EKG with *** QTc *** meds reviewed Pt aware [email or web address]   ICD *** intact function *** no programming changes made  Orthostatic /heat intolerance ***     {Are you  ordering a CV Procedure (e.g. stress test, cath, DCCV, TEE, etc)?   Press F2        :[id number]}     Dispo: ***  Signed, Charlies Macario Arthur, PA-C

## 2024-07-12 ENCOUNTER — Ambulatory Visit: Attending: Cardiology | Admitting: Physician Assistant

## 2024-07-15 ENCOUNTER — Encounter: Payer: Self-pay | Admitting: Physician Assistant

## 2024-08-07 NOTE — Progress Notes (Deleted)
 Cardiology Office Note:  .   Date:  08/07/2024  ID:  [name], DOB [birth year], MRN [id number] PCP: Ransom Other, MD  [facility] Providers Cardiologist:  None Electrophysiologist:  Elspeth Sage, MD {  History of Present Illness: [name] is a 43 y.o. female w/PMHx of  Bipolar d/o, anxiety/depression, panic attacks hypothyroidism Orthostatic intolerance/shower/heat intolerance  Dr. Sage describes concerns for Long QT syndrome, syncope > ICD  She saw Dr. Sage last Aug 2024, god days and bad days Episodes of lightheadedness are characterized by pallor and diaphoresis.   She also described falls where she drifts to one side and then falls down. Reported her diet as salt replete Nadolol continued No syncope/arrhythmias Symptoms suggest autonomic dysfunction orthostatic vital not revealing Requested social service support forms be filled out> deferred to her PMD, as well as further w/u into her falls.   Today's visit is scheduled as a annual visit ROS:   *** symptoms *** syncope *** new EP MD >> noone reading/check paceart *** meds, QT *** NO REMOTES   Device information MDT single chamber ICD implanted 10/12/2006, gen change 07/02/2016   Studies Reviewed: SABRA    EKG done today and reviewed by myself:  ***  DEVICE interrogation done today and reviewed by myself *** Battery and lead measurements are good ***   11/05/2016: TTE Study Conclusions  - Left ventricle: The cavity size was normal. Wall thickness was    normal. Systolic function was normal. The estimated ejection    fraction was in the range of 55% to 60%. Wall motion was normal;    there were no regional wall motion abnormalities. Left    ventricular diastolic function parameters were normal.  - Aortic valve: There was no stenosis.  - Mitral valve: There was no regurgitation.  - Right ventricle: The cavity size was normal. Pacer wire or    catheter noted in right ventricle.  Systolic function was normal.  - Pulmonary arteries: No complete TR doppler jet so unable to    estimate PA systolic pressure.  - Inferior vena cava: The vessel was normal in size. The    respirophasic diameter changes were in the normal range (= 50%),    consistent with normal central venous pressure.    11/04/2016: ETT The patient has TWI in lead I at baseline. She walked for 7:23 of a standard Bruce protocol GXT. Peak HR of 136 which is 74% predicted maximal HR . There were no ST or T wave changes to suggest ischemia . Nondiagnostic GXT due to inadequate HR response.     Risk Assessment/Calculations:    Physical Exam:   VS:  There were no vitals taken for this visit.   Wt Readings from Last 3 Encounters:  07/13/23 188 lb (85.3 kg)  10/08/16 140 lb 3.2 oz (63.6 kg)  07/02/16 151 lb (68.5 kg)    GEN: Well nourished, well developed in no acute distress NECK: No JVD; No carotid bruits CARDIAC: ***RRR, no murmurs, rubs, gallops RESPIRATORY:  *** CTA b/l without rales, wheezing or rhonchi  ABDOMEN: Soft, non-tender, non-distended EXTREMITIES: *** No edema; No deformity   ICD site: *** is stable, no thinning, fluctuation, tethering  ASSESSMENT AND PLAN: .    Long QT syndrome (?) EKG with *** QTc *** meds reviewed Pt aware [email or web address]   ICD *** intact function *** no programming changes made  Orthostatic /heat intolerance ***     {Are you ordering a CV Procedure (  e.g. stress test, cath, DCCV, TEE, etc)?   Press F2        :[id number]}     Dispo: ***  Signed, Charlies Macario Arthur, PA-C

## 2024-08-10 ENCOUNTER — Ambulatory Visit: Admitting: Physician Assistant

## 2024-10-31 ENCOUNTER — Telehealth (HOSPITAL_BASED_OUTPATIENT_CLINIC_OR_DEPARTMENT_OTHER): Payer: Self-pay | Admitting: *Deleted

## 2024-10-31 NOTE — Telephone Encounter (Signed)
 Patient scheduled for pre-op clearance on 11/07/24 with [name], NP.

## 2024-10-31 NOTE — Telephone Encounter (Signed)
   Name: [name]  DOB: 11/26/1981  MRN: [id number]  Primary Cardiologist: Formerly Dr. Fernande (last appt 07/13/2023)  Chart reviewed as part of pre-operative protocol coverage. Because of [name]'s past medical history and time since last visit, she will require a follow-up in-office visit in order to better assess preoperative cardiovascular risk.  Pre-op covering staff: - Please schedule appointment and call patient to inform them. If patient already had an upcoming appointment within acceptable timeframe, please add pre-op clearance to the appointment notes so provider is aware. - Please contact requesting surgeon's office via preferred method (i.e, phone, fax) to inform them of need for appointment prior to surgery.  Patient is not on anticoagulation or antiplatelet per review of current medical record in Epic.    Lamarr Satterfield, NP  10/31/2024, 11:02 AM

## 2024-10-31 NOTE — Telephone Encounter (Signed)
 Overdue to be seen in office >1 yr. Will give clearance once OV is completed.

## 2024-10-31 NOTE — Telephone Encounter (Signed)
   Pre-operative Risk Assessment    Patient Name: [name]  DOB: 1981-01-19 MRN: [id number]   Date of last office visit: 07/13/23 DR. KLEIN Date of next office visit: NONE  Request for Surgical Clearance    Procedure:  Dental Extraction - Amount of Teeth to be Pulled:  11 TEETH FOR SIMPLE EXTRACTIONS PER DDS OFFICE   Date of Surgery:  Clearance TBD                                Surgeon:  DR. NILU BHADANI, DDS Surgeon's Group or Practice Name:  [facility] Phone number:  [phone] Fax number:  [phone]   Type of Clearance Requested:   - Medical ; PER FORM PT STATES SHE IS ON A BLOOD THINNER; I DO NOT SEE A BLOOD THINNER NOTED IN HER MED LIST.   ALSO DDS STATES PT HAS DEFIBRILLATOR, WILL SEND TO OUR DEVICE CLINIC AS WELL   Type of Anesthesia:  Local ; PER FORM STATED NO EPI   Additional requests/questions:    Signed, Arish Redner   10/31/2024, 10:35 AM

## 2024-11-04 NOTE — Telephone Encounter (Signed)
 Spoke w/ patient - she is scheduled to see Jodie Passey, [place] 12/8 @ 11:20am. Patient is aware of her same day appointment with [name], NP as well as her PCP; Dr. Husain.

## 2024-11-07 ENCOUNTER — Other Ambulatory Visit (HOSPITAL_COMMUNITY): Payer: Self-pay

## 2024-11-07 ENCOUNTER — Encounter (HOSPITAL_BASED_OUTPATIENT_CLINIC_OR_DEPARTMENT_OTHER): Payer: Self-pay | Admitting: Family

## 2024-11-07 ENCOUNTER — Ambulatory Visit: Attending: Student | Admitting: Student

## 2024-11-07 ENCOUNTER — Telehealth: Payer: Self-pay | Admitting: Licensed Clinical Social Worker

## 2024-11-07 ENCOUNTER — Encounter: Payer: Self-pay | Admitting: Student

## 2024-11-07 ENCOUNTER — Ambulatory Visit (INDEPENDENT_AMBULATORY_CARE_PROVIDER_SITE_OTHER): Admitting: Family

## 2024-11-07 ENCOUNTER — Telehealth (HOSPITAL_BASED_OUTPATIENT_CLINIC_OR_DEPARTMENT_OTHER): Payer: Self-pay | Admitting: Family

## 2024-11-07 VITALS — BP 114/78 | HR 94 | Ht 64.0 in | Wt 193.0 lb

## 2024-11-07 VITALS — BP 112/78 | HR 94 | Ht 64.0 in | Wt 193.0 lb

## 2024-11-07 DIAGNOSIS — R0609 Other forms of dyspnea: Secondary | ICD-10-CM

## 2024-11-07 DIAGNOSIS — Z01810 Encounter for preprocedural cardiovascular examination: Secondary | ICD-10-CM | POA: Diagnosis not present

## 2024-11-07 DIAGNOSIS — I4581 Long QT syndrome: Secondary | ICD-10-CM

## 2024-11-07 DIAGNOSIS — Z5941 Food insecurity: Secondary | ICD-10-CM | POA: Diagnosis not present

## 2024-11-07 DIAGNOSIS — Z95810 Presence of automatic (implantable) cardiac defibrillator: Secondary | ICD-10-CM | POA: Diagnosis not present

## 2024-11-07 LAB — CUP PACEART INCLINIC DEVICE CHECK
Battery Remaining Longevity: 36 mo
Battery Voltage: 2.96 V
Brady Statistic RV Percent Paced: 0.01 %
Date Time Interrogation Session: 20251208104210
HighPow Impedance: 96 Ohm
Implantable Lead Connection Status: 753985
Implantable Lead Implant Date: 20071112
Implantable Lead Location: 753860
Implantable Lead Model: 7002
Implantable Pulse Generator Implant Date: 20170802
Lead Channel Impedance Value: 323 Ohm
Lead Channel Impedance Value: 437 Ohm
Lead Channel Pacing Threshold Amplitude: 2 V
Lead Channel Pacing Threshold Pulse Width: 0.4 ms
Lead Channel Sensing Intrinsic Amplitude: 6.25 mV
Lead Channel Sensing Intrinsic Amplitude: 7 mV
Lead Channel Setting Pacing Amplitude: 2.5 V
Lead Channel Setting Pacing Pulse Width: 1 ms
Lead Channel Setting Sensing Sensitivity: 0.3 mV
Zone Setting Status: 755011

## 2024-11-07 MED ORDER — NADOLOL 20 MG PO TABS
20.0000 mg | ORAL_TABLET | Freq: Every day | ORAL | 1 refills | Status: DC
  Filled 2024-11-07: qty 90, 90d supply, fill #0

## 2024-11-07 MED ORDER — NADOLOL 20 MG PO TABS: 20.0000 mg | ORAL_TABLET | Freq: Every day | ORAL | 1 refills | Status: DC

## 2024-11-07 MED ORDER — NADOLOL 40 MG PO TABS: 40.0000 mg | ORAL_TABLET | Freq: Every day | ORAL | 0 refills | Status: AC

## 2024-11-07 NOTE — Addendum Note (Signed)
 Addended by: GLADIS PORTER HERO on: 11/07/2024 01:50 PM   Modules accepted: Orders

## 2024-11-07 NOTE — Patient Instructions (Addendum)
 Medication Instructions:   Your physician recommends that you continue on your current medications as directed. Please refer to the Current Medication list given to you today.  We sent your Nadolol  to [facility] at [facility] location     Testing/Procedures:  Your physician has requested that you have an echocardiogram. Echocardiography is a painless test that uses sound waves to create images of your heart. It provides your doctor with information about the size and shape of your heart and how well your heart's chambers and valves are working. This procedure takes approximately one hour. There are no restrictions for this procedure. Please do NOT wear cologne, perfume, aftershave, or lotions (deodorant is allowed). Please arrive 15 minutes prior to your appointment time.  Please note: We ask at that you not bring children with you during ultrasound (echo/ vascular) testing. Due to room size and safety concerns, children are not allowed in the ultrasound rooms during exams. Our front office staff cannot provide observation of children in our lobby area while testing is being conducted. An adult accompanying a patient to their appointment will only be allowed in the ultrasound room at the discretion of the ultrasound technician under special circumstances. We apologize for any inconvenience.    Follow-Up:  TBD based on echo results   Special Instructions:   We have replaced a referral to social worker

## 2024-11-07 NOTE — Telephone Encounter (Signed)
 Ran a dispense report as well as called the pt about current dose of Nadolol  she is taking.   Pt states she has been taking Nadolol  40 mg po daily and this was previously refilled by her PCP Dr. Husain.  Pt states she has been taking the 40 mg tablets for quite sometime.  Pt states the cost of this dose through our pharmacy and [facility] pharmacy is the same.   Per [name], NP we will send in last dose of Nadolol  the pt has been currently taking, which is the 40 mg po daily dose.    Asked the pt which pharmacy she prefers this being sent to and she request that we send in the Nadolol  40 mg po daily to [facility] pharmacy, with a note stating fill later for pt will use her current supply on hand first and will call for refills.  Rx for Nadolol  40 mg po daily sent to [facility] pharmacy with a pharmacy note to please fill later, for pt will call for refills and use supply on-hand first.   Pt verbalized understanding and agrees with this plan.

## 2024-11-07 NOTE — Telephone Encounter (Signed)
 Pt came by with a filled prescription for nadolol  20 mg and stated that the mg was incorrect. Per pt her pcp had upped the dosage to 40 mg. Pcp had advised that they had communicated with Dr Fernande and Dr Fernande had approved. Please call back and advise.

## 2024-11-07 NOTE — Progress Notes (Signed)
  Electrophysiology Office Note:   ID:  [name], DOB 1981-09-03, MRN [id number]  Primary Cardiologist: None Electrophysiologist: Fonda Kitty, MD      History of Present Illness:   [name] is a 43 y.o. female with h/o syncope and long QT s/p MDT ICD, and hypothyroidism seen today for routine electrophysiology followup.   Since last being seen in our clinic the patient reports doing OK. Previously not enrolled in remote reports due to lack of Wi-fi, but now has. Will work to get set up.  She has been having DOE and ordered for Echo and gen cards appt earlier today.   Review of systems complete and found to be negative unless listed in HPI.   EP Information / Studies Reviewed:    EKG is not ordered today. EKG from 11/07/2024 with gen cards reviewed which showed NSR at 94 bpm       ICD Interrogation-  reviewed in detail today,  See PACEART report.  Arrhythmia/Device History MDT Single Chamber ICD 2007 for syncope in prolonged QT syndrome, gen change 2017  NOT MRI safe. St Jude Riata lead and MDT generator   Physical Exam:   VS:  BP 114/78   Pulse 94   Ht 5' 4 (1.626 m)   Wt 193 lb (87.5 kg)   SpO2 98%   BMI 33.13 kg/m    Wt Readings from Last 3 Encounters:  11/07/24 193 lb (87.5 kg)  11/07/24 193 lb (87.5 kg)  07/13/23 188 lb (85.3 kg)     GEN: No acute distress  NECK: No JVD; No carotid bruits CARDIAC: Regular rate and rhythm, no murmurs, rubs, gallops RESPIRATORY:  Clear to auscultation without rales, wheezing or rhonchi  ABDOMEN: Soft, non-tender, non-distended EXTREMITIES:  No edema; No deformity   ASSESSMENT AND PLAN:    Long QT with Syncope  s/p Medtronic single chamber ICD  euvolemic today Stable on an appropriate medical regimen Normal ICD function See Pace Art report No changes today  Chronically elevated RV threshold No V pacing and stable ~ 2.0V.   Preop Clearance Not device dependent.  Deemed acceptable risk from medical  perspective.  Disposition:   Follow up with Dr. Kitty in 12 months   Signed, Ozell Prentice Passey, PA-C

## 2024-11-07 NOTE — Telephone Encounter (Signed)
 H&V Care Navigation CSW Progress Note  Clinical Social Worker contacted patient by phone to f/u on referral for SDOH needs. Reached pt at [phone]. Introduced self, role, reason for call. Pt shares she is still at a PCP appt- encouraged her to call me when she gets safely home.   Patient is participating in a Managed Medicaid Plan:  Yes- [employer]  SDOH Screenings   Food Insecurity: Food Insecurity Present (11/07/2024)  Housing: High Risk (11/07/2024)  Physical Activity: Inactive (11/07/2024)  Tobacco Use: Low Risk  (11/07/2024)    [name], MSW, LCSW Clinical Social Worker II [facility] Care Navigation  [phone]- work cell phone (preferred)

## 2024-11-07 NOTE — Progress Notes (Signed)
 Cardiology Office Note   Date:  11/07/2024  ID:  KAYANI RAPAPORT, DOB 1981-11-29, MRN [id number] PCP: Ransom Other, MD  [facility] Providers Cardiologist:  None Electrophysiologist:  Elspeth Sage, MD (Inactive)     History of Present Illness [name] is a 43 y.o. female with history of syncope and long QT s/p Medtronic ICD implantation with gen change 07/2016, hypothyroidism.   Echo 2017 normal LVEF 55-60%, no significant valvular abnormalities. ETT 2017 no ischemia, normal BP/HR response to exercise.   Last evaluated by Dr. Sage 07/2023 with exercise and heat intolerance consistent with autonomic dysfunction. Dizziness with orthostatic vitals without objective change. She was advised to follow up in one year.   Presents today for preop clearance for extraction of 11 teeth with her significant other. Reports shortness of breath when up moving ongoing for longer than 6 months. This is getting worse. Some orthopnea, PND. Reports no lower extremity edema. She does get some swelling in her hands. Reports palpitations with sensation of heart racing every other day lasting a few minutes and self resolves. She does note high caffeine intake not much water. No chest pain, pressure, tightness. Having issues filling Nadolol  due to cost. Notes financial stressors including being one month behind on rent, food insecurity. She is overall sedentary.   ROS: Please see the history of present illness.    All other systems reviewed and are negative.   Studies Reviewed EKG Interpretation Date/Time:  Monday November 07 2024 08:42:14 EST Ventricular Rate:  94 PR Interval:  132 QRS Duration:  80 QT Interval:  382 QTC Calculation: 477 R Axis:   59  Text Interpretation: Normal sinus rhythm Low voltage QRS  No acute ST/T wave changes. Confirmed by Vannie Mora ([id number]) on 11/07/2024 8:58:22 AM    Cardiac Studies & Procedures    ______________________________________________________________________________________________   STRESS TESTS  EXERCISE TOLERANCE TEST (ETT) 11/05/2016  Interpretation Summary  The patient has TWI in lead I at baseline. She walked for 7:23 of a standard Bruce protocol GXT. Peak HR of 136 which is 74% predicted maximal HR . There were no ST or T wave changes to suggest ischemia .  Nondiagnostic GXT due to inadequate HR response.   ECHOCARDIOGRAM  ECHOCARDIOGRAM COMPLETE 11/05/2016  Narrative *[facility]* [address] [place], [place] [address] [phone]  ------------------------------------------------------------------- Transthoracic Echocardiography  Patient:    [name], [name] MR #:       [id number] Study Date: 11/05/2016 Gender:     F Age:        35 Height:     162.6 cm Weight:     63.6 kg BSA:        1.7 m^2 Pt. Status: Room:  BARTON Ransom Other [id number] TISA Sage, Elspeth BARTON Sage Elspeth ATTENDING    Verlin Bruckner PERFORMING   [facility], Outpatient SONOGRAPHER  [facility], RDCS  cc:  ------------------------------------------------------------------- LV EF: 55% -   60%  ------------------------------------------------------------------- Indications:      Chest Pain (R07.9).  ------------------------------------------------------------------- History:   PMH:  Prolonged QT Interval, AICD Implanted.  Chest pain.  Syncope and dyspnea.  ------------------------------------------------------------------- Study Conclusions  - Left ventricle: The cavity size was normal. Wall thickness was normal. Systolic function was normal. The estimated ejection fraction was in the range of 55% to 60%. Wall motion was normal; there were no regional wall motion abnormalities. Left ventricular diastolic function parameters were normal. - Aortic valve:  There was no stenosis. - Mitral valve: There was no regurgitation. -  Right ventricle: The cavity size was normal. Pacer wire or catheter noted in right ventricle. Systolic function was normal. - Pulmonary arteries: No complete TR doppler jet so unable to estimate PA systolic pressure. - Inferior vena cava: The vessel was normal in size. The respirophasic diameter changes were in the normal range (= 50%), consistent with normal central venous pressure.  Impressions:  - Normal study except for defibrillator noted in RV.  ------------------------------------------------------------------- Study data:  No prior study was available for comparison.  Study status:  Routine.  Procedure:  Transthoracic echocardiography. Image quality was adequate.          Transthoracic echocardiography.  M-mode, complete 2D, 3D, spectral Doppler, and color Doppler.  Birthdate:  Patient birthdate: 1981-07-15.  Age: Patient is 43 yr old.  Sex:  Gender: female.    BMI: 24.1 kg/m^2. Blood pressure:     102/60  Patient status:  Outpatient.  Study date:  Study date: 11/05/2016. Study time: 09:17 AM.  Location: [facility]  -------------------------------------------------------------------  ------------------------------------------------------------------- Left ventricle:  The cavity size was normal. Wall thickness was normal. Systolic function was normal. The estimated ejection fraction was in the range of 55% to 60%. Wall motion was normal; there were no regional wall motion abnormalities. The transmitral flow pattern was normal. The deceleration time of the early transmitral flow velocity was normal. The pulmonary vein flow pattern was normal. The tissue Doppler parameters were normal. Left ventricular diastolic function parameters were normal.  ------------------------------------------------------------------- Aortic valve:   Trileaflet.  Doppler:   There was no stenosis. There was no  regurgitation.  ------------------------------------------------------------------- Aorta:  Aortic root: The aortic root was normal in size. Ascending aorta: The ascending aorta was normal in size.  ------------------------------------------------------------------- Mitral valve:   Structurally normal valve.   Leaflet separation was normal.  Doppler:  Transvalvular velocity was within the normal range. There was no evidence for stenosis. There was no regurgitation.    Peak gradient (D): 4 mm Hg.  ------------------------------------------------------------------- Left atrium:  The atrium was normal in size.  ------------------------------------------------------------------- Right ventricle:  The cavity size was normal. Pacer wire or catheter noted in right ventricle. Systolic function was normal.  ------------------------------------------------------------------- Pulmonic valve:    Structurally normal valve.   Cusp separation was normal.  Doppler:  Transvalvular velocity was within the normal range. There was no regurgitation.  ------------------------------------------------------------------- Tricuspid valve:   Doppler:  There was trivial regurgitation.  ------------------------------------------------------------------- Pulmonary artery:   No complete TR doppler jet so unable to estimate PA systolic pressure.  ------------------------------------------------------------------- Right atrium:  The atrium was normal in size.  ------------------------------------------------------------------- Pericardium:  There was no pericardial effusion.  ------------------------------------------------------------------- Systemic veins: Inferior vena cava: The vessel was normal in size. The respirophasic diameter changes were in the normal range (= 50%), consistent with normal central venous pressure. Diameter: 15.2  mm.  ------------------------------------------------------------------- Measurements  IVC                                   Value        Reference ID                                    15.2  mm     ---------  Left ventricle  Value        Reference LV ID, ED, PLAX chordal        (L)    39.1  mm     43 - 52 LV ID, ES, PLAX chordal               29.7  mm     23 - 38 LV fx shortening, PLAX chordal (L)    24    %      >=29 LV PW thickness, ED                   8.43  mm     --------- IVS/LV PW ratio, ED                   0.9          <=1.3 Stroke volume, 2D                     54    ml     --------- Stroke volume/bsa, 2D                 32    ml/m^2 --------- LV e&', lateral                        22.8  cm/s   --------- LV E/e&', lateral                      4.34         --------- LV e&', medial                         10.4  cm/s   --------- LV E/e&', medial                       9.52         --------- LV e&', average                        16.6  cm/s   --------- LV E/e&', average                      5.96         --------- Longitudinal strain, TDI              23    %      ---------  Ventricular septum                    Value        Reference IVS thickness, ED                     7.59  mm     ---------  LVOT                                  Value        Reference LVOT ID, S                            18    mm     --------- LVOT area  2.54  cm^2   --------- LVOT peak velocity, S                 81.3  cm/s   --------- LVOT mean velocity, S                 51.1  cm/s   --------- LVOT VTI, S                           21.3  cm     ---------  Aorta                                 Value        Reference Aortic root ID, ED                    26    mm     --------- Ascending aorta ID, A-P, S            25    mm     ---------  Left atrium                           Value        Reference LA ID, A-P, ES                        32    mm      --------- LA ID/bsa, A-P                        1.88  cm/m^2 <=2.2 LA volume, S                          18.5  ml     --------- LA volume/bsa, S                      10.9  ml/m^2 --------- LA volume, ES, 1-p A4C                17.7  ml     --------- LA volume/bsa, ES, 1-p A4C            10.4  ml/m^2 --------- LA volume, ES, 1-p A2C                19.2  ml     --------- LA volume/bsa, ES, 1-p A2C            11.3  ml/m^2 ---------  Mitral valve                          Value        Reference Mitral E-wave peak velocity           99    cm/s   --------- Mitral A-wave peak velocity           64    cm/s   --------- Mitral deceleration time              211   ms     150 - 230 Mitral peak gradient, D               4     mm Hg  --------- Mitral E/A  ratio, peak                1.5          ---------  Right ventricle                       Value        Reference TAPSE                                 19.8  mm     --------- RV s&', lateral, S                     11.2  cm/s   ---------  Legend: (L)  and  (H)  mark values outside specified reference range.  ------------------------------------------------------------------- Prepared and Electronically Authenticated by  Ezra Shuck, M.D. 2017-12-06T16:33:50          ______________________________________________________________________________________________      Risk Assessment/Calculations           Physical Exam VS:  BP 112/78 (BP Location: Right Arm, Patient Position: Sitting, Cuff Size: Normal)   Pulse 94   Ht 5' 4 (1.626 m)   Wt 193 lb (87.5 kg)   SpO2 98%   BMI 33.13 kg/m        Wt Readings from Last 3 Encounters:  11/07/24 193 lb (87.5 kg)  07/13/23 188 lb (85.3 kg)  10/08/16 140 lb 3.2 oz (63.6 kg)    GEN: Well nourished, well developed in no acute distress NECK: No JVD; No carotid bruits CARDIAC: RRR, no murmurs, rubs, gallops RESPIRATORY:  Clear to auscultation without rales, wheezing or rhonchi  ABDOMEN: Soft,  non-tender, non-distended EXTREMITIES:  No edema; No deformity   ASSESSMENT AND PLAN  Preop - According to the Revised Cardiac Risk Index (RCRI), her Perioperative Risk of Major Cardiac Event is (%): 0.4. Her Functional Capacity in METs is: 5.07 according to the Duke Activity Status Index (DASI). Per AHA/ACC guidelines, she is deemed acceptable risk for the planned procedure without additional cardiovascular testing. Will route to surgical team so they are aware.  She does not take antiplatelet nor anticoagulant that would need to be held. Device clearance will come from her EP appointment later today.  Long QT s/p PPM - Stable EKG today. Reports palpitations. Additionally suspect some of her activity intolerance is related to tachycardia and autonomic dysfunction as previously documented by Dr. Fernande. Recommend reduced caffeine intake. Resume Nadolol  20mg  daily, Rx sent to [facility], routed to Prior Auth team as well to ensure coverage. Has follow up with EP later today  DOE - Reports DOE and orthopnea but no edema nor chest pain. EKG today no acute ST/T wave changes. Lung sounds clear on exam, no murmur. Suspect multifactorial autonomic dysfunction and deconditioning. Will plan for echocardiogram to rule out cardiac abnormality, this does not need performed prior to her dental work.   Food insecurity / Nutritional therapist - Notes financial stressors, one month behind on rent, food insecurity.  Food pantry bag provided in clinic. Referred to SW.       Dispo: Follow up with EP as scheduled later today. Follow up with general cardiology pending echocardiogram results  Signed, Andrea GORMAN Finder, NP

## 2024-11-07 NOTE — Patient Instructions (Signed)
 Medication Instructions:  No medication changes today. *If you need a refill on your cardiac medications before your next appointment, please call your pharmacy*  Lab Work: No labwork ordered today. If you have labs (blood work) drawn today and your tests are completely normal, you will receive your results only by: MyChart Message (if you have MyChart) OR A paper copy in the mail If you have any lab test that is abnormal or we need to change your treatment, we will call you to review the results.  Testing/Procedures: No testing ordered today  Follow-Up: At [facility], you and your health needs are our priority.  As part of our continuing mission to provide you with exceptional heart care, our providers are all part of one team.  This team includes your primary Cardiologist (physician) and Advanced Practice Providers or APPs (Physician Assistants and Nurse Practitioners) who all work together to provide you with the care you need, when you need it.  Your next appointment:   12 month(s)  Provider:   You may see Ardeen Kohler, MD or one of the following Advanced Practice Providers on your designated Care Team:   Mertha Abrahams, Kennard Pea "Jonelle Neri" Bonanza, PA-C Suzann Riddle, NP Creighton Doffing, NP    We recommend signing up for the patient portal called "MyChart".  Sign up information is provided on this After Visit Summary.  MyChart is used to connect with patients for Virtual Visits (Telemedicine).  Patients are able to view lab/test results, encounter notes, upcoming appointments, etc.  Non-urgent messages can be sent to your provider as well.   To learn more about what you can do with MyChart, go to [email or web address].

## 2024-11-08 ENCOUNTER — Telehealth: Payer: Self-pay | Admitting: Licensed Clinical Social Worker

## 2024-11-08 ENCOUNTER — Telehealth: Payer: Self-pay | Admitting: Pharmacy Technician

## 2024-11-08 ENCOUNTER — Encounter: Payer: Self-pay | Admitting: Cardiology

## 2024-11-08 ENCOUNTER — Other Ambulatory Visit (HOSPITAL_COMMUNITY): Payer: Self-pay

## 2024-11-08 NOTE — Telephone Encounter (Addendum)
 H&V Care Navigation CSW Progress Note  Clinical Social Worker contacted patient by phone to f/u on needs expressed during [facility] appt on 12/8. No answer at [phone], left voicemail. Will re-attempt again as able.  Patient is participating in a Managed Medicaid Plan:  Yes  SDOH Screenings   Food Insecurity: Food Insecurity Present (11/07/2024)  Housing: High Risk (11/07/2024)  Physical Activity: Inactive (11/07/2024)  Tobacco Use: Low Risk  (11/07/2024)    Marit Lark, MSW, LCSW Clinical Social Worker II [facility] Heart/Vascular Care Navigation  [phone]- work cell phone (preferred)

## 2024-11-08 NOTE — Telephone Encounter (Signed)
 Pharmacy Patient Advocate Encounter   Received notification from Physician's Office that prior authorization for Nadolol  40 MG is required/requested.   Insurance verification completed.   The patient is insured through [employer].   Per test claim: Refill too soon. PA is not needed at this time. Medication was filled 11/07/24. Next eligible fill date is 12/08/24.

## 2024-11-08 NOTE — Progress Notes (Signed)
 PERIOPERATIVE PRESCRIPTION FOR IMPLANTED CARDIAC DEVICE PROGRAMMING  Patient Information: Name:  [name]  DOB:  05-15-1981  MRN:  [id number]  Procedure:  Dental Extraction - Amount of Teeth to be Pulled:  11 TEETH FOR SIMPLE EXTRACTIONS PER DDS OFFICE    Date of Surgery:  Clearance TBD                                  Surgeon:  DR. HORACIO SARIS, DDS Surgeon's Group or Practice Name:  [facility] Phone number:  [phone] Fax number:  [phone]   Type of Clearance Requested:     ALSO DDS STATES PT HAS DEFIBRILLATOR  Device Information:  Clinic EP Physician:  Dr. Fonda Kitty  Device Type:  Defibrillator Manufacturer and Phone #:  Medtronic: [phone] Pacemaker Dependent?:  No. Date of Last Device Check:  11/07/2024 Normal Device Function?:  Yes.    Electrophysiologist's Recommendations:  Have magnet available. Provide continuous ECG monitoring when magnet is used or reprogramming is to be performed.  Procedure should not interfere with device function.  No device programming or magnet placement needed.  Per Device Clinic Standing Orders, Andrea RAGIN, RN  7:28 AM 11/08/2024

## 2024-11-09 ENCOUNTER — Telehealth: Payer: Self-pay | Admitting: Licensed Clinical Social Worker

## 2024-11-09 NOTE — Progress Notes (Signed)
 Heart and Vascular Care Navigation  11/09/2024  [name] [birth year] [id number]  Reason for Referral: financial concerns Patient is participating in a Managed Medicaid Plan:Yes  Engaged with patient by telephone for initial visit for Heart and Vascular Care Coordination.                                                                                                   Assessment:                                     LCSW was able to reach pt today. Introduced self, role, reason for call. Confirmed full name, DOB and home address. Pt resides with husband Norleen (DPR on file) who joins the call today.  Pt currently has pending disability claim, they rely on spouse's disability income only to make ends meet (~$944/mo) and SNAP (~$293/mo) their rent is currently $517.95/mo. Pt had been assisted with food bag at [employer], will send additional food and utility resources for [employer].   Pt spouse shares he had to make a necessary repair and that took extra money from their check in November. He was able to make that payment this month, but now since he used December's income doesn't have money to pay for December. LCSW offered assistance from Patient Care Fund to catch them back up. At this time pt is all paid up for utilities.   Was able to connect and receive W9 from leasing agency for property. Sent patient care fund agreement to pt email [email or web address].   She and spouse sent me letter stating they reside together, pt spouse provided SNAP card and copy of rent statement. Added these to Sharepoint and will coordinate with my colleague to complete request as I will be out of clinic these next two days. Provided pt with my contact information.   HRT/VAS Care Coordination     Patients Home Cardiology Office [facility]   Outpatient Care Team Social Worker   Social Worker Name: Marit Lark, [place], [phone]   Living arrangements for the past 2 months Single Family  Home   Lives with: Spouse   Patient Current Insurance Coverage Medicaid   Patient Has Concern With Paying Medical Bills No   Does Patient Have Prescription Coverage? Yes   Home Assistive Devices/Equipment None       Social History:                                                                             SDOH Screenings   Food Insecurity: Food Insecurity Present (11/09/2024)  Housing: High Risk (11/09/2024)  Transportation Needs: No Transportation Needs (11/09/2024)  Utilities: Not At Risk (11/09/2024)  Financial Resource Strain: Medium Risk (11/09/2024)  Physical  Activity: Inactive (11/07/2024)  Tobacco Use: Low Risk  (11/07/2024)  Health Literacy: Adequate Health Literacy (11/09/2024)    SDOH Interventions: Financial Resources:  Financial Strain Interventions: Other (Comment) (community resources for [employer] and food provided; also electronic data systems Patient Care [employer]) DSS for financial assistance  Food Insecurity:  Food Insecurity Interventions: [employer] Provided (will send [employer] energy company)  Housing Insecurity:  Housing Interventions: Other (Comment) (Patient Care Fund Assistance)  Transportation:   Transportation Interventions: Intervention Not Indicated     Other Care Navigation Interventions:     Provided Pharmacy assistance resources  Pt able to get medications, asked for copays to be billed   Follow-up plan:   LCSW mailed pt the following: my card, food pantry list and will f/u with colleague to complete Patient Care Fund Agreement form and submission.

## 2024-11-10 NOTE — Telephone Encounter (Signed)
 Spoke with pt regarding preop clearance. Pt was made aware that clearance was faxed to surgeon's office and surgeon's office should be calling to schedule a date for her procedure. Pt was advise to call surgeon's office to f/u. Pt verbalized understanding and agreed. Pt was advise to call our office if she has any further concerns.

## 2024-11-10 NOTE — Telephone Encounter (Signed)
Pt calling to f/u; please advise

## 2024-11-14 ENCOUNTER — Ambulatory Visit

## 2024-11-15 NOTE — Telephone Encounter (Signed)
Pt called to f/u-please advise.

## 2024-11-15 NOTE — Telephone Encounter (Signed)
 I s/w the pt and assured her that the ov notes from [name], NP were faxed on 11/07/24 that she has cleared the pt. Pt said well then she is either getting a run around from our office or the DDS office. I stated sorry she feels that way but we have sent the fax and I am more than happy to re-fax the notes. I confirmed the dental practice. Pt thanked me for the help.

## 2024-11-16 ENCOUNTER — Ambulatory Visit: Payer: Self-pay | Admitting: Cardiology

## 2024-11-16 NOTE — Telephone Encounter (Signed)
 Attempted to contact the office to receive correct fax number, received no answer. [id number]

## 2024-11-16 NOTE — Telephone Encounter (Signed)
 Callback, please contact dental office by phone to confirm an alternate fax number - appears some issue receiving the copies faxed by our office previously. Thank you!

## 2024-11-16 NOTE — Telephone Encounter (Signed)
 Patient calling again to ask her clearance be refaxed to dental office. Fax number is [phone]

## 2024-11-17 NOTE — Telephone Encounter (Signed)
 I will re-fax the clearance notes to the fax # [phone] provided yesterday by DDS office.   We do apologize for the error on the fax#.

## 2024-11-21 ENCOUNTER — Telehealth: Payer: Self-pay | Admitting: Licensed Clinical Social Worker

## 2024-11-21 NOTE — Telephone Encounter (Signed)
 H&V Care Navigation CSW Progress Note  Clinical Social Worker contacted patient by phone to let her know assistance with rent has been mailed. Called and also notified [employer] that the check will be placed in the mail. No additional questions at this time. Remain available moving forward with assistance as needed.  Patient is participating in a Managed Medicaid Plan:  Yes- healthy Blue  SDOH Screenings   Food Insecurity: Food Insecurity Present (11/09/2024)  Housing: High Risk (11/09/2024)  Transportation Needs: No Transportation Needs (11/09/2024)  Utilities: Not At Risk (11/09/2024)  Financial Resource Strain: Medium Risk (11/09/2024)  Physical Activity: Inactive (11/07/2024)  Tobacco Use: Low Risk (11/07/2024)  Health Literacy: Adequate Health Literacy (11/09/2024)    [name], MSW, LCSW Clinical Social Worker II [facility] Heart/Vascular Care Navigation  [phone]- work cell phone (preferred)

## 2024-11-23 ENCOUNTER — Ambulatory Visit: Attending: Internal Medicine

## 2024-11-23 DIAGNOSIS — I4581 Long QT syndrome: Secondary | ICD-10-CM

## 2024-11-29 LAB — CUP PACEART REMOTE DEVICE CHECK
Battery Remaining Longevity: 35 mo
Battery Voltage: 2.96 V
Brady Statistic RV Percent Paced: 0.01 %
Date Time Interrogation Session: 20251229222304
HighPow Impedance: 105 Ohm
Implantable Lead Connection Status: 753985
Implantable Lead Implant Date: 20071112
Implantable Lead Location: 753860
Implantable Lead Model: 7002
Implantable Pulse Generator Implant Date: 20170802
Lead Channel Impedance Value: 380 Ohm
Lead Channel Impedance Value: 456 Ohm
Lead Channel Pacing Threshold Amplitude: 2.375 V
Lead Channel Pacing Threshold Pulse Width: 0.4 ms
Lead Channel Sensing Intrinsic Amplitude: 8.125 mV
Lead Channel Sensing Intrinsic Amplitude: 8.125 mV
Lead Channel Setting Pacing Amplitude: 2.5 V
Lead Channel Setting Pacing Pulse Width: 1 ms
Lead Channel Setting Sensing Sensitivity: 0.3 mV
Zone Setting Status: 755011

## 2024-11-29 NOTE — Telephone Encounter (Signed)
 Requesting office inquiring if pt has been cleared. In my conversation today with the DDS office it was stated to me that 8 teeth are simple extractions and 3 teeth for surgical extractions.   Pt was recently seen in the office 11/07/24 [name], NP with cardiac testing that was ordered at appt.   I will have preop APP review if the pt has been cleared.

## 2024-11-29 NOTE — Telephone Encounter (Signed)
" ° °  Patient Name: [name]  DOB: 1980-12-14 MRN: [id number]  Primary Cardiologist: None  Chart reviewed as part of pre-operative protocol coverage. Pre-op clearance already addressed by colleagues in earlier phone notes. To summarize recommendations:  -Preop - According to the Revised Cardiac Risk Index (RCRI), her Perioperative Risk of Major Cardiac Event is (%): 0.4. Her Functional Capacity in METs is: 5.07 according to the [facility] Activity Status Index (DASI). Per AHA/ACC guidelines, she is deemed acceptable risk for the planned procedure without additional cardiovascular testing. Will route to surgical team so they are aware.  She does not take antiplatelet nor anticoagulant that would need to be held. Device clearance will come from her EP appointment later today.  [name], NP 11/07/2024  Echocardiogram is scheduled but does not need to be done before clearance.   Will route this bundled recommendation to requesting provider via Epic fax function and remove from pre-op pool. Please call with questions.  [name], PA-C 11/29/2024, 11:39 AM  "

## 2024-11-30 NOTE — Progress Notes (Signed)
 Remote ICD Transmission

## 2024-11-30 NOTE — Telephone Encounter (Signed)
 Pt states the clearance sent to her dentist office was not the correct info, just a call log between doctors. She left a clearance form with M. Tillery at her last appt and is asking it be signed and returned. Please advise.

## 2024-11-30 NOTE — Telephone Encounter (Signed)
 Error

## 2024-11-30 NOTE — Telephone Encounter (Signed)
 Spoke with the patient regarding preop clearance request. Informed her that it has been routed to the dentist office with all required information however the surgeon's office is requiring their form to be filled out and will not accept the OV notes routed. Patient expressed her frustration as she seems like she has been trying to get this taken care of. Informed her that once we receive the clearance request the information is placed into our templated and completed per our protocol. Please advise as the surgeon's office stated that they cannot complete her procedure unless their form is completed. Also informed the patient that we currently dont have the form.

## 2024-12-02 NOTE — Telephone Encounter (Signed)
 If requesting office is requiring the form to be filled out the patient will need to bring the form to the office or they will work need to fax the form to our office to be filled out by her cardiologist.  As there is nothing further for preop APP to contribute will remove from pool.

## 2024-12-02 NOTE — Telephone Encounter (Signed)
 I will send a message to the dental office as to the protocol for the cardiologist and our preop team. Our preop team has been in place x approx 7 yrs. Our cardiology practice has 7 satellite locations. Our protocol and uniform template keeps all request in the same manner and fashion. It is always our practice to be sure that all information is correct when entering onto our template. We do apologize if any information is missed, though we strive to be sure no information is missed.   However, if the dental office needs their form filled out they will have to reach out to the pt and give her another form, that the pt will then need to bring to the cardiologist and ask for it to be filled out and signed.   There are times that the pt may see a provider and then they may refer the pt out to another provider., Not sure if this is the case and the pt is seeing another dental office, if so then we will need the request from that office.

## 2024-12-03 ENCOUNTER — Ambulatory Visit: Payer: Self-pay | Admitting: Cardiology

## 2024-12-06 NOTE — Telephone Encounter (Signed)
 Staff message sent to [facility];   Cassey Hurrell M, CMA  Lesia Ozell Barter, PA-C Erskin Heck,  S/w the pt today who is extremely upset about her dental clearance. The pt was cleared, however, in short what is happening is the DDS is not accepting our notes and they want their form signed. Pt said she dropped off the dental form last Monday for you to please sign. Pt would like to pick the form up in person once you have it signed. D/w the pt that I will ask Heck or he can have his CMA or Nurse call her when the form is ready to be picked up this week. See all the notes in the chart. Pt is very upset and frustrated and just needs to get her dental surgery done. I was able at the end to assure her that we will work on this very hard about getting the DDS form signed. Call her when signed, so she can pick it up herself and bring it to the DDS herself.  Also pt wanted to know which EP cardiologist will be he new MD in place of Dr. Fernande.  Pt asked to be called back at [phone]  Thank you Heck for all of your help as well.  Thank you Niels

## 2024-12-06 NOTE — Telephone Encounter (Signed)
 Patient is calling for update. She states that she drop the form off to our office to be filled out. Please advise

## 2024-12-08 NOTE — Telephone Encounter (Signed)
 I s/w Jodie Passey, [facility] and his CMA Mindy today. No DDS form was in his box or the other providers boxes that they could find. Per Graeme Jodie is only in the office today until 12:20 pm.   I assured Jodie that I will call the pt and ask if she may please drop the form off again to be signed for DDS for proep clearance.   I called pt and left vm asking if she could drop form off for you in plenty of time before you leave today. I asked for to call back and confirm she got my message. I apologized on my vm for the inconvenience we have caused.

## 2024-12-08 NOTE — Telephone Encounter (Signed)
 I had called pt earlier this morning, see notes and again just now in trying to be sure that we [facility] can get the dental form for preop clearance taken care for the pt. I again left message Jodie Passey, [facility] is only in the office today until 12 pm.

## 2024-12-15 ENCOUNTER — Other Ambulatory Visit (HOSPITAL_BASED_OUTPATIENT_CLINIC_OR_DEPARTMENT_OTHER)

## 2025-01-06 ENCOUNTER — Ambulatory Visit (HOSPITAL_BASED_OUTPATIENT_CLINIC_OR_DEPARTMENT_OTHER)

## 2025-01-06 DIAGNOSIS — R0609 Other forms of dyspnea: Secondary | ICD-10-CM

## 2025-01-06 LAB — ECHOCARDIOGRAM COMPLETE
AR max vel: 1.94 cm2
AV Area VTI: 2.44 cm2
AV Area mean vel: 1.97 cm2
AV Mean grad: 3 mmHg
AV Peak grad: 5.4 mmHg
Ao pk vel: 1.16 m/s
Area-P 1/2: 3.91 cm2
S' Lateral: 2.57 cm

## 2025-02-13 ENCOUNTER — Ambulatory Visit

## 2025-02-22 ENCOUNTER — Ambulatory Visit

## 2025-05-15 ENCOUNTER — Ambulatory Visit

## 2025-05-24 ENCOUNTER — Ambulatory Visit
